# Patient Record
Sex: Female | Born: 1940
Health system: Southern US, Community
[De-identification: ages and names within clinical notes are randomized; demographics above are authoritative.]

## PROBLEM LIST (undated history)

## (undated) DIAGNOSIS — C4490 Unspecified malignant neoplasm of skin, unspecified: Secondary | ICD-10-CM

## (undated) DIAGNOSIS — Z83719 Family history of colon polyps, unspecified: Secondary | ICD-10-CM

## (undated) DIAGNOSIS — K219 Gastro-esophageal reflux disease without esophagitis: Secondary | ICD-10-CM

## (undated) DIAGNOSIS — C4491 Basal cell carcinoma of skin, unspecified: Secondary | ICD-10-CM

## (undated) DIAGNOSIS — Z8371 Family history of colonic polyps: Secondary | ICD-10-CM

## (undated) DIAGNOSIS — I10 Essential (primary) hypertension: Secondary | ICD-10-CM

## (undated) DIAGNOSIS — T7840XA Allergy, unspecified, initial encounter: Secondary | ICD-10-CM

## (undated) DIAGNOSIS — M1990 Unspecified osteoarthritis, unspecified site: Secondary | ICD-10-CM

## (undated) HISTORY — PX: BREAST BIOPSY: SHX20

## (undated) HISTORY — DX: Gastro-esophageal reflux disease without esophagitis: K21.9

## (undated) HISTORY — DX: Unspecified malignant neoplasm of skin, unspecified: C44.90

## (undated) HISTORY — DX: Basal cell carcinoma of skin, unspecified: C44.91

## (undated) HISTORY — PX: COLONOSCOPY WITH ESOPHAGOGASTRODUODENOSCOPY (EGD): SHX5779

---

## 1975-03-27 HISTORY — PX: ABDOMINAL HYSTERECTOMY: SHX81

## 1975-03-27 HISTORY — PX: APPENDECTOMY: SHX54

## 2005-02-01 ENCOUNTER — Ambulatory Visit: Payer: Self-pay | Admitting: Family Medicine

## 2006-03-08 ENCOUNTER — Ambulatory Visit: Payer: Self-pay | Admitting: Family Medicine

## 2007-10-21 ENCOUNTER — Ambulatory Visit: Payer: Self-pay | Admitting: Family Medicine

## 2008-01-16 ENCOUNTER — Ambulatory Visit: Payer: Self-pay | Admitting: Unknown Physician Specialty

## 2008-03-26 HISTORY — PX: VEIN SURGERY: SHX48

## 2008-10-25 ENCOUNTER — Ambulatory Visit: Payer: Self-pay | Admitting: Family Medicine

## 2009-11-16 ENCOUNTER — Ambulatory Visit: Payer: Self-pay | Admitting: Family Medicine

## 2010-01-26 ENCOUNTER — Ambulatory Visit: Payer: Self-pay | Admitting: Unknown Physician Specialty

## 2010-01-27 LAB — PATHOLOGY REPORT

## 2010-11-29 ENCOUNTER — Ambulatory Visit: Payer: Self-pay | Admitting: Family Medicine

## 2012-03-17 ENCOUNTER — Ambulatory Visit: Payer: Self-pay | Admitting: Family Medicine

## 2012-03-26 HISTORY — PX: COLON SURGERY: SHX602

## 2013-03-24 ENCOUNTER — Ambulatory Visit: Payer: Self-pay | Admitting: Family Medicine

## 2013-03-31 ENCOUNTER — Ambulatory Visit: Payer: Self-pay | Admitting: Surgery

## 2013-03-31 DIAGNOSIS — I10 Essential (primary) hypertension: Secondary | ICD-10-CM

## 2013-03-31 LAB — COMPREHENSIVE METABOLIC PANEL
Albumin: 3.6 g/dL (ref 3.4–5.0)
Alkaline Phosphatase: 74 U/L
Anion Gap: 3 — ABNORMAL LOW (ref 7–16)
BILIRUBIN TOTAL: 0.4 mg/dL (ref 0.2–1.0)
BUN: 16 mg/dL (ref 7–18)
Calcium, Total: 9.1 mg/dL (ref 8.5–10.1)
Chloride: 102 mmol/L (ref 98–107)
Co2: 29 mmol/L (ref 21–32)
Creatinine: 0.78 mg/dL (ref 0.60–1.30)
GLUCOSE: 99 mg/dL (ref 65–99)
Osmolality: 269 (ref 275–301)
Potassium: 3.9 mmol/L (ref 3.5–5.1)
SGOT(AST): 22 U/L (ref 15–37)
SGPT (ALT): 19 U/L (ref 12–78)
Sodium: 134 mmol/L — ABNORMAL LOW (ref 136–145)
Total Protein: 7.5 g/dL (ref 6.4–8.2)

## 2013-03-31 LAB — CBC WITH DIFFERENTIAL/PLATELET
BASOS ABS: 0.1 10*3/uL (ref 0.0–0.1)
Basophil %: 0.8 %
EOS PCT: 2.3 %
Eosinophil #: 0.2 10*3/uL (ref 0.0–0.7)
HCT: 37.9 % (ref 35.0–47.0)
HGB: 13 g/dL (ref 12.0–16.0)
LYMPHS ABS: 2.9 10*3/uL (ref 1.0–3.6)
Lymphocyte %: 39.3 %
MCH: 29.4 pg (ref 26.0–34.0)
MCHC: 34.3 g/dL (ref 32.0–36.0)
MCV: 86 fL (ref 80–100)
MONO ABS: 0.5 x10 3/mm (ref 0.2–0.9)
Monocyte %: 6.3 %
Neutrophil #: 3.8 10*3/uL (ref 1.4–6.5)
Neutrophil %: 51.3 %
Platelet: 213 10*3/uL (ref 150–440)
RBC: 4.42 10*6/uL (ref 3.80–5.20)
RDW: 13.4 % (ref 11.5–14.5)
WBC: 7.4 10*3/uL (ref 3.6–11.0)

## 2013-04-07 ENCOUNTER — Inpatient Hospital Stay: Payer: Self-pay | Admitting: Surgery

## 2013-04-08 LAB — PLATELET COUNT: PLATELETS: 215 10*3/uL (ref 150–440)

## 2013-04-08 LAB — HEMOGLOBIN: HGB: 13.2 g/dL (ref 12.0–16.0)

## 2013-04-10 LAB — PATHOLOGY REPORT

## 2014-07-17 NOTE — Op Note (Signed)
PATIENT NAME:  Katherine Gallegos, Katherine Gallegos I MR#:  [id number] DATE OF BIRTH:  June 02, 1940  DATE OF PROCEDURE:  04/07/2013  PREOPERATIVE DIAGNOSIS: Right colonic polyp.   POSTOPERATIVE DIAGNOSIS: Right colonic polyp.   PROCEDURE: Laparoscopic right colectomy.   SURGEON: Rochel Brome, MD  ANESTHESIA: General.   INDICATIONS: This 74 year old female recently had colonoscopy findings of a serrated adenoma of the cecum. This was sessile and could not be removed with the laparoscope and surgery was recommended for definitive treatment. Also noted is that she has had a previous hysterectomy.   DESCRIPTION OF PROCEDURE: The patient was placed on the operating table in the supine position under general anesthesia. The abdomen was prepared with ChloraPrep and draped in a sterile manner.   A short incision was made below the umbilicus and carried down to the deep fascia which was grasped with laryngeal hook and elevated. A Veress needle was inserted, aspirated, and irrigated with a saline solution. Next, the peritoneal cavity was insufflated with carbon dioxide. The Veress needle was removed. The 10 mm cannula was inserted. The 10 mm, 0 degree laparoscope was inserted to view the peritoneal cavity. The right colon was noted. There were multiple adhesions between the right colon and the anterior abdominal wall. The liver appeared normal. Another incision was made in the epigastrium to insert another 11 mm cannula. Another incision was made in the right lower quadrant to introduce a 5 mm cannula and ultimately a fourth cannula, which was a 5 mm cannula, in the right upper quadrant.   With the patient turned a few degrees to the left and in some Trendelenburg position multiple adhesions were dissected with the Harmonic scalpel in the right lower quadrant mobilizing the cecum and the terminal ileum. Also the dissection was carried up to the hepatic flexure. Subsequently, I did note that there were some adhesions between the  omentum and the pelvis which were divided with the Harmonic scalpel mobilizing the omentum. Next, a portion of omentum was separated from the right transverse colon and the right transverse colon was mobilized. There were some adhesions between the right transverse colon and the gallbladder, which were divided with the Harmonic scalpel. The right colon was separated from attachments to the stomach and also on mobilization the duodenum was noted and mesentery dissected away from the duodenum. Further dissection was carried out on the right colon. The ureter was identified. The colon was further mobilized and subsequently it had been mobilized satisfactorily to complete the laparoscopic portion of the procedure. It was noted that the appendix was not seen and apparently was removed when she had a hysterectomy.   Next, the laparoscopic instruments were removed. The two 5 mm port sites were closed with 4-0 nylon vertical mattress sutures. An incision was made from the infraumbilical port site to the supraumbilical port site and this incision was approximately 2-1/2 inches in length and was carried down through subcutaneous tissues. The linea alba was incised. The peritoneal cavity was opened enough that the right colon could be delivered up onto the abdominal wall. There was no grossly palpable mass and there was no grossly palpable adenopathy. A window was created in the mesentery of the small bowel just approximately 4 inches proximal to the ileocecal valve. Also a window was created in the transverse colon slightly to the right of the palpable middle colic vessels. The mesenteric dissection was begun at both sites with the Harmonic scalpel. It is noted that the dissection was carried out proximally and distally until  the ileocolic artery and vein were ligated with 0 chromic and divided with the Harmonic scalpel. Next, the small bowel was placed beside the transverse colon. There were some adhesions attaching the  omentum to the tenia coli of the transverse colon and these were dissected free. Next, holding the bowel with Allis clamps an enterotomy was made and also a colotomy made with electrocautery. Anastomosis was begun with insertion of the GIA 75 mm stapler and began the anastomosis along the antimesenteric border. The staple line was hemostatic. The anastomosis was completed with application of the OI-32 stapler, which was placed perpendicular to the first. The anastomosis looked good, was widely patent. Several small bleeding points were cauterized. The junction of the staple line was imbricated with 5-0 Vicryl. Also, the apex of the staple line was imbricated with 5-0 Vicryl. The mesenteric defect was closed with running 3-0 chromic. Hemostasis was intact. It is noted that during the course of the procedure there was minimal amount of bleeding. There was some bleeding related to mobilizing the omentum, but this subsequently resolved. Estimated blood loss for the procedure was approximately 15 mL.   Having completed the anastomosis and closure of the mesenteric defect the omentum was brought beneath the wound. The gloves, gown, instruments, and towels were changed. Next, the pull suction was inserted and there was very minimal amount of serosanguineous fluid within the abdominal cavity, and the pull suction was removed. Next, the midline fascia was closed with interrupted 0 Maxon figure-of-eight sutures. The skin was closed with interrupted 4-0 nylon vertical mattress sutures. Dressings were applied with paper tape. The patient tolerated surgery satisfactorily and was then prepared for transfer to the recovery room. ____________________________ Lenna Sciara. Rochel Brome, MD jws:sb D: 04/07/2013 15:10:05 ET T: 04/07/2013 15:26:29 ET JOB#: [id number]  cc: Loreli Dollar, MD, <Dictator> Loreli Dollar MD ELECTRONICALLY SIGNED 04/08/2013 18:09

## 2014-07-17 NOTE — Discharge Summary (Signed)
PATIENT NAME:  Katherine Gallegos, Katherine Gallegos I MR#:  [id number] DATE OF BIRTH:  June 29, 1940  DATE OF ADMISSION:  04/07/2013 DATE OF DISCHARGE:  04/10/2013  HISTORY OF PRESENT ILLNESS: This 74 year old female was admitted through the outpatient surgery department for laparoscopic right colectomy. She had recently had colonoscopy with findings of a 15 mm serrated adenoma of the cecum.   Other history includes hypertension.   Details are recorded on the typed H and P.   She did have bowel preparation at home and was brought in through the outpatient surgery department and was carried to the operating room. She did have a preop prophylactic antibiotic. Had a laparoscopic right colectomy.   Postoperatively, she was treated with IV fluids and prophylactic subcutaneous heparin and analgesics. She was initially begun on a clear liquid diet and later advanced to a full liquid diet, which she tolerated well. Did demonstrate bowel activity, passing some stool and gas and had minimal postoperative pain.  Final pathology demonstrated a 1.9 cm sessile serrated adenoma of the cecum.  Eleven lymph nodes were unremarkable.   DIAGNOSIS:  Serrated adenoma of the cecum.   OPERATION: Laparoscopic right colectomy.   DISCHARGE INSTRUCTIONS: Wound care instructions given and plans made for follow-up in the office.  ____________________________ J. Rochel Brome, MD jws:dp D: 04/21/2013 09:37:11 ET T: 04/21/2013 10:06:07 ET JOB#: [id number]  cc: Loreli Dollar, MD, <Dictator> Loreli Dollar MD ELECTRONICALLY SIGNED 04/25/2013 19:50

## 2014-07-21 ENCOUNTER — Other Ambulatory Visit: Payer: Self-pay | Admitting: Family Medicine

## 2014-07-21 DIAGNOSIS — Z1231 Encounter for screening mammogram for malignant neoplasm of breast: Secondary | ICD-10-CM

## 2014-08-05 ENCOUNTER — Ambulatory Visit: Payer: Medicare HMO

## 2014-08-11 ENCOUNTER — Ambulatory Visit
Admission: RE | Admit: 2014-08-11 | Discharge: 2014-08-11 | Disposition: A | Discharge status: Home / Self Care | Payer: Commercial Managed Care - HMO | Source: Ambulatory Visit | Attending: Family Medicine | Admitting: Family Medicine

## 2014-08-11 DIAGNOSIS — Z1231 Encounter for screening mammogram for malignant neoplasm of breast: Secondary | ICD-10-CM | POA: Insufficient documentation

## 2014-08-19 ENCOUNTER — Ambulatory Visit: Payer: Medicare HMO

## 2015-07-11 DIAGNOSIS — R5383 Other fatigue: Secondary | ICD-10-CM | POA: Diagnosis not present

## 2015-07-11 DIAGNOSIS — R5381 Other malaise: Secondary | ICD-10-CM | POA: Diagnosis not present

## 2015-07-11 DIAGNOSIS — I10 Essential (primary) hypertension: Secondary | ICD-10-CM | POA: Diagnosis not present

## 2015-07-11 DIAGNOSIS — E785 Hyperlipidemia, unspecified: Secondary | ICD-10-CM | POA: Diagnosis not present

## 2015-07-18 ENCOUNTER — Other Ambulatory Visit: Payer: Self-pay | Admitting: Family Medicine

## 2015-07-18 DIAGNOSIS — Z0000 Encounter for general adult medical examination without abnormal findings: Secondary | ICD-10-CM | POA: Diagnosis not present

## 2015-07-18 DIAGNOSIS — Z1231 Encounter for screening mammogram for malignant neoplasm of breast: Secondary | ICD-10-CM

## 2015-07-18 DIAGNOSIS — E784 Other hyperlipidemia: Secondary | ICD-10-CM | POA: Diagnosis not present

## 2015-07-18 DIAGNOSIS — I10 Essential (primary) hypertension: Secondary | ICD-10-CM | POA: Diagnosis not present

## 2015-08-04 DIAGNOSIS — M47816 Spondylosis without myelopathy or radiculopathy, lumbar region: Secondary | ICD-10-CM | POA: Diagnosis not present

## 2015-08-04 DIAGNOSIS — M545 Low back pain: Secondary | ICD-10-CM | POA: Diagnosis not present

## 2015-08-10 DIAGNOSIS — M545 Low back pain: Secondary | ICD-10-CM | POA: Diagnosis not present

## 2015-08-12 DIAGNOSIS — M546 Pain in thoracic spine: Secondary | ICD-10-CM | POA: Diagnosis not present

## 2015-08-15 ENCOUNTER — Ambulatory Visit: Payer: Commercial Managed Care - HMO

## 2015-08-24 ENCOUNTER — Other Ambulatory Visit: Payer: Self-pay | Admitting: Family Medicine

## 2015-08-24 ENCOUNTER — Ambulatory Visit
Admission: RE | Admit: 2015-08-24 | Discharge: 2015-08-24 | Disposition: A | Discharge status: Home / Self Care | Payer: PPO | Source: Ambulatory Visit | Attending: Family Medicine | Admitting: Family Medicine

## 2015-08-24 DIAGNOSIS — Z1231 Encounter for screening mammogram for malignant neoplasm of breast: Secondary | ICD-10-CM | POA: Diagnosis not present

## 2015-08-25 ENCOUNTER — Other Ambulatory Visit: Payer: Self-pay | Admitting: Family Medicine

## 2015-08-25 DIAGNOSIS — R921 Mammographic calcification found on diagnostic imaging of breast: Secondary | ICD-10-CM

## 2015-08-30 ENCOUNTER — Ambulatory Visit
Admission: RE | Admit: 2015-08-30 | Discharge: 2015-08-30 | Disposition: A | Discharge status: Home / Self Care | Payer: PPO | Source: Ambulatory Visit | Attending: Family Medicine | Admitting: Family Medicine

## 2015-08-30 ENCOUNTER — Other Ambulatory Visit: Payer: Self-pay | Admitting: Family Medicine

## 2015-08-30 DIAGNOSIS — R921 Mammographic calcification found on diagnostic imaging of breast: Secondary | ICD-10-CM | POA: Diagnosis not present

## 2015-10-04 DIAGNOSIS — I10 Essential (primary) hypertension: Secondary | ICD-10-CM | POA: Diagnosis not present

## 2015-10-07 DIAGNOSIS — Z0130 Encounter for examination of blood pressure without abnormal findings: Secondary | ICD-10-CM | POA: Diagnosis not present

## 2015-11-09 DIAGNOSIS — I10 Essential (primary) hypertension: Secondary | ICD-10-CM | POA: Diagnosis not present

## 2016-01-23 ENCOUNTER — Other Ambulatory Visit: Payer: Self-pay | Admitting: Family Medicine

## 2016-01-23 DIAGNOSIS — R928 Other abnormal and inconclusive findings on diagnostic imaging of breast: Secondary | ICD-10-CM

## 2016-02-08 DIAGNOSIS — N390 Urinary tract infection, site not specified: Secondary | ICD-10-CM | POA: Diagnosis not present

## 2016-02-08 DIAGNOSIS — H6123 Impacted cerumen, bilateral: Secondary | ICD-10-CM | POA: Diagnosis not present

## 2016-02-08 DIAGNOSIS — I10 Essential (primary) hypertension: Secondary | ICD-10-CM | POA: Diagnosis not present

## 2016-02-09 DIAGNOSIS — H6123 Impacted cerumen, bilateral: Secondary | ICD-10-CM | POA: Diagnosis not present

## 2016-02-09 DIAGNOSIS — I10 Essential (primary) hypertension: Secondary | ICD-10-CM | POA: Diagnosis not present

## 2016-02-09 DIAGNOSIS — N390 Urinary tract infection, site not specified: Secondary | ICD-10-CM | POA: Diagnosis not present

## 2016-02-10 DIAGNOSIS — N390 Urinary tract infection, site not specified: Secondary | ICD-10-CM | POA: Diagnosis not present

## 2016-03-05 ENCOUNTER — Other Ambulatory Visit: Payer: Self-pay | Admitting: Family Medicine

## 2016-03-05 ENCOUNTER — Ambulatory Visit
Admission: RE | Admit: 2016-03-05 | Discharge: 2016-03-05 | Disposition: A | Discharge status: Home / Self Care | Payer: PPO | Source: Ambulatory Visit | Attending: Family Medicine | Admitting: Family Medicine

## 2016-03-05 DIAGNOSIS — R928 Other abnormal and inconclusive findings on diagnostic imaging of breast: Secondary | ICD-10-CM

## 2016-03-05 DIAGNOSIS — R921 Mammographic calcification found on diagnostic imaging of breast: Secondary | ICD-10-CM

## 2016-04-02 ENCOUNTER — Ambulatory Visit
Admission: RE | Admit: 2016-04-02 | Discharge: 2016-04-02 | Disposition: A | Discharge status: Home / Self Care | Payer: PPO | Source: Ambulatory Visit | Attending: Family Medicine | Admitting: Family Medicine

## 2016-04-02 DIAGNOSIS — R921 Mammographic calcification found on diagnostic imaging of breast: Secondary | ICD-10-CM | POA: Diagnosis not present

## 2016-04-02 DIAGNOSIS — R928 Other abnormal and inconclusive findings on diagnostic imaging of breast: Secondary | ICD-10-CM

## 2016-04-02 DIAGNOSIS — N6012 Diffuse cystic mastopathy of left breast: Secondary | ICD-10-CM | POA: Diagnosis not present

## 2016-04-02 HISTORY — PX: BREAST BIOPSY: SHX20

## 2016-04-03 ENCOUNTER — Ambulatory Visit: Payer: Commercial Managed Care - HMO

## 2016-04-03 LAB — SURGICAL PATHOLOGY

## 2016-04-06 DIAGNOSIS — Z86010 Personal history of colonic polyps: Secondary | ICD-10-CM | POA: Diagnosis not present

## 2016-04-06 DIAGNOSIS — Z800 Family history of malignant neoplasm of digestive organs: Secondary | ICD-10-CM | POA: Insufficient documentation

## 2016-05-29 DIAGNOSIS — I10 Essential (primary) hypertension: Secondary | ICD-10-CM | POA: Diagnosis not present

## 2016-06-12 ENCOUNTER — Encounter: Payer: Self-pay | Admitting: *Deleted

## 2016-06-13 ENCOUNTER — Ambulatory Visit
Admission: RE | Admit: 2016-06-13 | Discharge: 2016-06-13 | Disposition: A | Discharge status: Home / Self Care | Payer: PPO | Source: Ambulatory Visit | Attending: Unknown Physician Specialty | Admitting: Unknown Physician Specialty

## 2016-06-13 ENCOUNTER — Encounter: Payer: Self-pay | Admitting: *Deleted

## 2016-06-13 ENCOUNTER — Ambulatory Visit: Payer: PPO | Admitting: Anesthesiology

## 2016-06-13 ENCOUNTER — Encounter
Admission: RE | Disposition: A | Discharge status: Home / Self Care | Payer: Self-pay | Source: Ambulatory Visit | Attending: Unknown Physician Specialty

## 2016-06-13 DIAGNOSIS — Z1211 Encounter for screening for malignant neoplasm of colon: Secondary | ICD-10-CM | POA: Insufficient documentation

## 2016-06-13 DIAGNOSIS — K648 Other hemorrhoids: Secondary | ICD-10-CM | POA: Diagnosis not present

## 2016-06-13 DIAGNOSIS — K5790 Diverticulosis of intestine, part unspecified, without perforation or abscess without bleeding: Secondary | ICD-10-CM | POA: Diagnosis not present

## 2016-06-13 DIAGNOSIS — I10 Essential (primary) hypertension: Secondary | ICD-10-CM | POA: Diagnosis not present

## 2016-06-13 DIAGNOSIS — Z803 Family history of malignant neoplasm of breast: Secondary | ICD-10-CM | POA: Insufficient documentation

## 2016-06-13 DIAGNOSIS — K5730 Diverticulosis of large intestine without perforation or abscess without bleeding: Secondary | ICD-10-CM | POA: Diagnosis not present

## 2016-06-13 DIAGNOSIS — Z90710 Acquired absence of both cervix and uterus: Secondary | ICD-10-CM | POA: Insufficient documentation

## 2016-06-13 DIAGNOSIS — M1990 Unspecified osteoarthritis, unspecified site: Secondary | ICD-10-CM | POA: Diagnosis not present

## 2016-06-13 DIAGNOSIS — Z808 Family history of malignant neoplasm of other organs or systems: Secondary | ICD-10-CM | POA: Diagnosis not present

## 2016-06-13 DIAGNOSIS — Z86010 Personal history of colonic polyps: Secondary | ICD-10-CM | POA: Insufficient documentation

## 2016-06-13 DIAGNOSIS — Z79899 Other long term (current) drug therapy: Secondary | ICD-10-CM | POA: Diagnosis not present

## 2016-06-13 DIAGNOSIS — K640 First degree hemorrhoids: Secondary | ICD-10-CM | POA: Insufficient documentation

## 2016-06-13 DIAGNOSIS — D124 Benign neoplasm of descending colon: Secondary | ICD-10-CM | POA: Diagnosis not present

## 2016-06-13 DIAGNOSIS — K635 Polyp of colon: Secondary | ICD-10-CM | POA: Diagnosis not present

## 2016-06-13 HISTORY — DX: Essential (primary) hypertension: I10

## 2016-06-13 HISTORY — DX: Family history of colonic polyps: Z83.71

## 2016-06-13 HISTORY — PX: COLONOSCOPY WITH PROPOFOL: SHX5780

## 2016-06-13 HISTORY — DX: Unspecified osteoarthritis, unspecified site: M19.90

## 2016-06-13 HISTORY — DX: Family history of colon polyps, unspecified: Z83.719

## 2016-06-13 HISTORY — DX: Allergy, unspecified, initial encounter: T78.40XA

## 2016-06-13 SURGERY — COLONOSCOPY WITH PROPOFOL
Anesthesia: General

## 2016-06-13 MED ORDER — SODIUM CHLORIDE 0.9 % IJ SOLN
INTRAMUSCULAR | Status: AC
  Filled 2016-06-13: qty 10

## 2016-06-13 MED ORDER — PROPOFOL 500 MG/50ML IV EMUL
INTRAVENOUS | Status: AC
  Filled 2016-06-13: qty 50

## 2016-06-13 MED ORDER — EPHEDRINE SULFATE 50 MG/ML IJ SOLN
INTRAMUSCULAR | Status: DC | PRN
  Administered 2016-06-13: 10 mg via INTRAVENOUS

## 2016-06-13 MED ORDER — FENTANYL CITRATE (PF) 100 MCG/2ML IJ SOLN
INTRAMUSCULAR | Status: DC | PRN
  Administered 2016-06-13 (×2): 50 ug via INTRAVENOUS

## 2016-06-13 MED ORDER — SODIUM CHLORIDE 0.9 % IV SOLN: INTRAVENOUS | Status: DC

## 2016-06-13 MED ORDER — FENTANYL CITRATE (PF) 100 MCG/2ML IJ SOLN
INTRAMUSCULAR | Status: AC
  Filled 2016-06-13: qty 2

## 2016-06-13 MED ORDER — LIDOCAINE HCL (PF) 2 % IJ SOLN
INTRAMUSCULAR | Status: DC | PRN
Start: 2016-06-13 — End: 2016-06-13
  Administered 2016-06-13: 40 mg

## 2016-06-13 MED ORDER — PROPOFOL 10 MG/ML IV BOLUS
INTRAVENOUS | Status: DC | PRN
  Administered 2016-06-13: 10 mg via INTRAVENOUS
  Administered 2016-06-13: 40 mg via INTRAVENOUS

## 2016-06-13 MED ORDER — LIDOCAINE HCL (PF) 2 % IJ SOLN
INTRAMUSCULAR | Status: AC
  Filled 2016-06-13: qty 2

## 2016-06-13 MED ORDER — PROPOFOL 500 MG/50ML IV EMUL
INTRAVENOUS | Status: DC | PRN
  Administered 2016-06-13: 50 ug/kg/min via INTRAVENOUS

## 2016-06-13 MED ORDER — SODIUM CHLORIDE 0.9 % IV SOLN
INTRAVENOUS | Status: DC
  Administered 2016-06-13: 1000 mL via INTRAVENOUS

## 2016-06-13 MED ORDER — EPHEDRINE SULFATE 50 MG/ML IJ SOLN
INTRAMUSCULAR | Status: AC
  Filled 2016-06-13: qty 1

## 2016-06-13 NOTE — Op Note (Signed)
[facility] Patient Name: Katherine Gallegos Procedure Date: 06/13/2016 11:05 AM MRN: [id number] Account #: [id number] Date of Birth: [birth year] Admit Type: Outpatient Age: 76 Room: [facility] ENDO ROOM 4 Gender: Female Note Status: Finalized Procedure:            Colonoscopy Indications:          High risk colon cancer surveillance: Personal history                        of colonic polyps Providers:            Manya Silvas, MD Referring MD:         Irven Easterly. Kary Kos, MD (Referring MD) Medicines:            Propofol per Anesthesia Complications:        No immediate complications. Procedure:            Pre-Anesthesia Assessment:                       - After reviewing the risks and benefits, the patient                        was deemed in satisfactory condition to undergo the                        procedure.                       After obtaining informed consent, the colonoscope was                        passed under direct vision. Throughout the procedure,                        the patient's blood pressure, pulse, and oxygen                        saturations were monitored continuously. The                        Colonoscope was introduced through the anus and                        advanced to the the cecum, identified by appendiceal                        orifice and ileocecal valve. The colonoscopy was                        somewhat difficult due to a tortuous colon. The patient                        tolerated the procedure well. The quality of the bowel                        preparation was excellent. Findings:      A 8 mm polyp was found in the distal descending colon. The polyp was       sessile. The polyp was removed with a cold snare. Resection and       retrieval were complete.  A diminutive polyp was found in the descending colon. The polyp was       sessile. The polyp was removed with a jumbo cold forceps. Resection and   retrieval were complete.      A few small-mouthed diverticula were found in the sigmoid colon and       descending colon.      Internal hemorrhoids were found during endoscopy. The hemorrhoids were       small and Grade I (internal hemorrhoids that do not prolapse).      The exam was otherwise without abnormality. Impression:           - One 8 mm polyp in the distal descending colon,                        removed with a cold snare. Resected and retrieved.                       - One diminutive polyp in the descending colon, removed                        with a jumbo cold forceps. Resected and retrieved.                       - Diverticulosis in the sigmoid colon and in the                        descending colon.                       - Internal hemorrhoids.                       - The examination was otherwise normal. Recommendation:       - Await pathology results. Manya Silvas, MD 06/13/2016 11:51:43 AM This report has been signed electronically. Number of Addenda: 0 Note Initiated On: 06/13/2016 11:05 AM Scope Withdrawal Time: 0 hours 13 minutes 14 seconds  Total Procedure Duration: 0 hours 23 minutes 54 seconds       [facility]

## 2016-06-13 NOTE — H&P (Signed)
   Primary Care Physician:  Maryland Pink, MD Primary Gastroenterologist:  Dr. Vira Agar  Pre-Procedure History & Physical: HPI:  Katherine Gallegos is a 76 y.o. female is here for an colonoscopy.   Past Medical History:  Diagnosis Date  . Allergic state   . Arthritis   . FH: colon polyps   . Hypertension     Past Surgical History:  Procedure Laterality Date  . ABDOMINAL HYSTERECTOMY    . BREAST BIOPSY Bilateral 20 + yrs    excisional bx. negative results  . BREAST BIOPSY Left 04/02/2016   Affirm Biopsy- Path pending  . COLONOSCOPY WITH ESOPHAGOGASTRODUODENOSCOPY (EGD)    . VEIN SURGERY  2010    Prior to Admission medications   Medication Sig Start Date End Date Taking? Authorizing Provider  amLODipine (NORVASC) 5 MG tablet Take 7.5 mg by mouth daily.   Yes Historical Provider, MD  metoprolol tartrate (LOPRESSOR) 25 MG tablet Take 25 mg by mouth 2 (two) times daily.   Yes Historical Provider, MD  polyethylene glycol powder (GLYCOLAX/MIRALAX) powder Take 1 Container by mouth once.   Yes Historical Provider, MD    Allergies as of 05/15/2016  . (Not on File)    Family History  Problem Relation Age of Onset  . Cancer Sister     bone ca.   . Breast cancer Sister     Social History   Social History  . Marital status: Married    Spouse name: N/A  . Number of children: N/A  . Years of education: N/A   Occupational History  . Not on file.   Social History Main Topics  . Smoking status: Never Smoker  . Smokeless tobacco: Never Used  . Alcohol use No  . Drug use: No  . Sexual activity: Not on file   Other Topics Concern  . Not on file   Social History Narrative  . No narrative on file    Review of Systems: See HPI, otherwise negative ROS  Physical Exam: BP 137/67   Pulse 64   Temp 97.7 F (36.5 C) (Tympanic)   Resp 18   Ht 5\' 3"  (1.6 m)   Wt 58.1 kg (128 lb)   SpO2 100%   BMI 22.67 kg/m  General:   Alert,  pleasant and cooperative in NAD Head:   Normocephalic and atraumatic. Neck:  Supple; no masses or thyromegaly. Lungs:  Clear throughout to auscultation.    Heart:  Regular rate and rhythm. Abdomen:  Soft, nontender and nondistended. Normal bowel sounds, without guarding, and without rebound.   Neurologic:  Alert and  oriented x4;  grossly normal neurologically.  Impression/Plan: Katherine Gallegos is here for an colonoscopy to be performed for [facility] colon polyp.  Risks, benefits, limitations, and alternatives regarding  colonoscopy have been reviewed with the patient.  Questions have been answered.  All parties agreeable.   Gaylyn Cheers, MD  06/13/2016, 11:13 AM

## 2016-06-13 NOTE — Anesthesia Post-op Follow-up Note (Cosign Needed)
Anesthesia QCDR form completed.        

## 2016-06-13 NOTE — Anesthesia Preprocedure Evaluation (Signed)
Anesthesia Evaluation  Patient identified by MRN, date of birth, ID band Patient awake    Reviewed: Allergy & Precautions, NPO status , Patient's Chart, lab work & pertinent test results  Airway Mallampati: II       Dental  (+) Teeth Intact   Pulmonary neg pulmonary ROS,    breath sounds clear to auscultation       Cardiovascular Exercise Tolerance: Good hypertension, Pt. on home beta blockers  Rhythm:Regular     Neuro/Psych negative neurological ROS  negative psych ROS   GI/Hepatic negative GI ROS, Neg liver ROS,   Endo/Other  negative endocrine ROS  Renal/GU negative Renal ROS     Musculoskeletal   Abdominal Normal abdominal exam  (+)   Peds negative pediatric ROS (+)  Hematology negative hematology ROS (+)   Anesthesia Other Findings   Reproductive/Obstetrics                             Anesthesia Physical Anesthesia Plan  ASA: II  Anesthesia Plan: General   Post-op Pain Management:    Induction: Intravenous  Airway Management Planned: Natural Airway and Nasal Cannula  Additional Equipment:   Intra-op Plan:   Post-operative Plan:   Informed Consent: I have reviewed the patients History and Physical, chart, labs and discussed the procedure including the risks, benefits and alternatives for the proposed anesthesia with the patient or authorized representative who has indicated his/her understanding and acceptance.     Plan Discussed with: CRNA  Anesthesia Plan Comments:         Anesthesia Quick Evaluation

## 2016-06-13 NOTE — Anesthesia Postprocedure Evaluation (Signed)
Anesthesia Post Note  Patient: Katherine Gallegos  Procedure(s) Performed: Procedure(s) (LRB): COLONOSCOPY WITH PROPOFOL (N/A)  Patient location during evaluation: PACU Anesthesia Type: General Level of consciousness: awake Pain management: pain level controlled Vital Signs Assessment: post-procedure vital signs reviewed and stable Respiratory status: spontaneous breathing Cardiovascular status: stable Anesthetic complications: no     Last Vitals:  Vitals:   06/13/16 1213 06/13/16 1223  BP: 129/67 135/76  Pulse: 67 66  Resp: 15 20  Temp:      Last Pain:  Vitals:   06/13/16 1153  TempSrc: Tympanic                 VAN STAVEREN,[name]

## 2016-06-13 NOTE — Transfer of Care (Signed)
Immediate Anesthesia Transfer of Care Note  Patient: Katherine Gallegos  Procedure(s) Performed: Procedure(s): COLONOSCOPY WITH PROPOFOL (N/A)  Patient Location: PACU  Anesthesia Type:General  Level of Consciousness: sedated  Airway & Oxygen Therapy: Patient Spontanous Breathing and Patient connected to nasal cannula oxygen  Post-op Assessment: Report given to RN and Post -op Vital signs reviewed and stable  Post vital signs: Reviewed and stable  Last Vitals:  Vitals:   06/13/16 1040  BP: 137/67  Pulse: 64  Resp: 18  Temp: 36.5 C    Last Pain:  Vitals:   06/13/16 1040  TempSrc: Tympanic         Complications: No apparent anesthesia complications

## 2016-06-14 ENCOUNTER — Encounter: Payer: Self-pay | Admitting: Unknown Physician Specialty

## 2016-06-14 LAB — SURGICAL PATHOLOGY

## 2016-07-18 DIAGNOSIS — H524 Presbyopia: Secondary | ICD-10-CM | POA: Diagnosis not present

## 2016-07-18 DIAGNOSIS — H52223 Regular astigmatism, bilateral: Secondary | ICD-10-CM | POA: Diagnosis not present

## 2016-07-18 DIAGNOSIS — H40003 Preglaucoma, unspecified, bilateral: Secondary | ICD-10-CM | POA: Diagnosis not present

## 2016-07-18 DIAGNOSIS — H5203 Hypermetropia, bilateral: Secondary | ICD-10-CM | POA: Diagnosis not present

## 2016-07-19 DIAGNOSIS — H6123 Impacted cerumen, bilateral: Secondary | ICD-10-CM | POA: Diagnosis not present

## 2016-07-19 DIAGNOSIS — I10 Essential (primary) hypertension: Secondary | ICD-10-CM | POA: Diagnosis not present

## 2016-07-19 DIAGNOSIS — Z0000 Encounter for general adult medical examination without abnormal findings: Secondary | ICD-10-CM | POA: Diagnosis not present

## 2016-07-19 DIAGNOSIS — E785 Hyperlipidemia, unspecified: Secondary | ICD-10-CM | POA: Diagnosis not present

## 2016-10-31 DIAGNOSIS — I10 Essential (primary) hypertension: Secondary | ICD-10-CM | POA: Diagnosis not present

## 2017-01-29 DIAGNOSIS — R300 Dysuria: Secondary | ICD-10-CM | POA: Diagnosis not present

## 2017-02-12 NOTE — Progress Notes (Signed)
02/13/2017 7:45 PM   Katherine Gallegos 1940/07/12 [id number]  Referring provider: Maryland Pink, MD [address] [facility] [place], [place] [address]  Chief Complaint  Patient presents with  . Dysuria    HPI: Patient is a 76 -year-old Caucasian female who is referred to [place] by, Marthe Patch, for dysuria.  She had been experiencing suprapubic pressure, a bulge in the vagina and trickling during the day.  Her symptoms seems to abate at night.    Today, she complains of frequency, urgency, dysuria (intermittent) and nocturia x 3 (gets up with husband) and hesitancy.  Her PVR is 13 mL.   She denies gross hematuria, back pain, abdominal pain or flank pain.  She has not had any recent fevers, chills, nausea or vomiting.  Her UA is unremarkable.    She does not have a history of nephrolithiasis, GU surgery or GU trauma.   She is not sexually active.  She is postmenopausal.     She denies constipation and/or diarrhea.   She does engage in good perineal hygiene. She does not take tub baths.  She does not have incontinence.    She is not having pain with bladder filling.    She has not had any recent imaging studies.    She is drinking 2 to 3 bottles of water daily.     Reviewed referral notes.    PMH: Past Medical History:  Diagnosis Date  . Allergic state   . Arthritis   . FH: colon polyps   . Hypertension   . Skin cancer     Surgical History: Past Surgical History:  Procedure Laterality Date  . ABDOMINAL HYSTERECTOMY  1977   Partial  . APPENDECTOMY  1977  . BREAST BIOPSY Bilateral 20 + yrs    excisional bx. negative results  . BREAST BIOPSY Left 04/02/2016   Affirm Biopsy- Path pending  . COLON SURGERY  2014   Large Polypectomy with Bowel Resection (Dr. Rochel Brome)  . COLONOSCOPY WITH ESOPHAGOGASTRODUODENOSCOPY (EGD)    . COLONOSCOPY WITH PROPOFOL N/A 06/13/2016   Procedure: COLONOSCOPY WITH PROPOFOL;  Surgeon: Manya Silvas, MD;  Location:  [facility] ENDOSCOPY;  Service: Endoscopy;  Laterality: N/A;  . VEIN SURGERY  2010    Home Medications:  Allergies as of 02/13/2017      Reactions   Hydrochlorothiazide Rash   Latex Rash   Lovastatin Other (See Comments)   Muscle pain      Medication List        Accurate as of 02/13/17 11:59 PM. Always use your most recent med list.          acetaminophen 325 MG tablet Commonly known as:  TYLENOL Take 650 mg by mouth every 6 (six) hours as needed.   amLODipine 5 MG tablet Commonly known as:  NORVASC Take 7.5 mg by mouth daily.   conjugated estrogens vaginal cream Commonly known as:  PREMARIN Place 1 Applicatorful vaginally daily. Apply 0.5mg  (pea-sized amount)  just inside the vaginal introitus with a finger-tip every night for two weeks and then Monday, Wednesday and Friday nights.   estradiol 0.1 MG/GM vaginal cream Commonly known as:  ESTRACE VAGINAL Apply 0.5mg  (pea-sized amount)  just inside the vaginal introitus with a finger-tip every night for two weeks and then Monday, Wednesday and Friday nights.   metoprolol tartrate 25 MG tablet Commonly known as:  LOPRESSOR Take 25 mg by mouth 2 (two) times daily.  Allergies:  Allergies  Allergen Reactions  . Hydrochlorothiazide Rash  . Latex Rash  . Lovastatin Other (See Comments)    Muscle pain    Family History: Family History  Problem Relation Age of Onset  . Cancer Sister        bone ca.   . Breast cancer Sister   . Hematuria Neg Hx   . Kidney cancer Neg Hx   . Kidney disease Neg Hx   . Prostate cancer Neg Hx   . Sickle cell trait Neg Hx   . Tuberculosis Neg Hx     Social History:  reports that  has never smoked. she has never used smokeless tobacco. She reports that she does not drink alcohol or use drugs.  ROS: UROLOGY Frequent Urination?: Yes Hard to postpone urination?: Yes Burning/pain with urination?: Yes Get up at night to urinate?: Yes Leakage of urine?: No Urine stream starts and  stops?: No Trouble starting stream?: Yes Do you have to strain to urinate?: No Blood in urine?: No Urinary tract infection?: No Sexually transmitted disease?: No Injury to kidneys or bladder?: No Painful intercourse?: No Weak stream?: No Currently pregnant?: No Vaginal bleeding?: No  Gastrointestinal Nausea?: No Vomiting?: No Indigestion/heartburn?: No Diarrhea?: No Constipation?: No  Constitutional Fever: No Night sweats?: No Weight loss?: No Fatigue?: No  Skin Skin rash/lesions?: Yes Itching?: Yes  Eyes Blurred vision?: Yes Double vision?: No  Ears/Nose/Throat Sore throat?: No Sinus problems?: Yes  Hematologic/Lymphatic Swollen glands?: No Easy bruising?: No  Cardiovascular Leg swelling?: No Chest pain?: No  Respiratory Cough?: No Shortness of breath?: No  Endocrine Excessive thirst?: No  Musculoskeletal Back pain?: Yes Joint pain?: Yes  Neurological Headaches?: No Dizziness?: No  Psychologic Depression?: No Anxiety?: No  Physical Exam: BP 131/74   Pulse 62   Ht 5\' 3"  (1.6 m)   Wt 130 lb 4.8 oz (59.1 kg)   BMI 23.08 kg/m   Constitutional: Well nourished. Alert and oriented, No acute distress. HEENT: [place] AT, moist mucus membranes. Trachea midline, no masses. Cardiovascular: No clubbing, cyanosis, or edema. Respiratory: Normal respiratory effort, no increased work of breathing. GI: Abdomen is soft, non tender, non distended, no abdominal masses. Liver and spleen not palpable.  No hernias appreciated.  Stool sample for occult testing is not indicated.   GU: No CVA tenderness.  No bladder fullness or masses.  Atrophic external genitalia, normal pubic hair distribution, no lesions.  Normal urethral meatus, no lesions, no prolapse, no discharge.   No urethral masses, tenderness and/or tenderness. No bladder fullness, tenderness or masses. Pale vagina mucosa, poor estrogen effect, no discharge, no lesions, poor pelvic support, Grade III  cystocele or rectocele noted. Cervix and uterus is surgically absent.  No adnexal/parametria masses or tenderness noted.  Anus and perineum are without rashes or lesions.    Skin: No rashes, bruises or suspicious lesions. Lymph: No cervical or inguinal adenopathy. Neurologic: Grossly intact, no focal deficits, moving all 4 extremities. Psychiatric: Normal mood and affect.  Laboratory Data: Urinalysis Unremarkable.  See Epic. I have reviewed the labs.   Pertinent Imaging: Results for JACLYNNE, BALDO (MRN [id number]) as of 02/24/2017 19:49  Ref. Range 02/13/2017 10:31  Scan Result Unknown 13 ml     Assessment & Plan:    1. Cystocele  - will refer to gynecology for pessary fitting as patient is finding her cystocele uncomfortable  2. Vaginal atrophy  - Patient was given a sample of vaginal estrogen cream (Premarin) and instructed to apply  0.5mg  (pea-sized amount)  just inside the vaginal introitus with a finger-tip on Monday, Wednesday and Friday nights.  I explained to the patient that vaginally administered estrogen, which causes only a slight increase in the blood estrogen levels, have fewer contraindications and adverse systemic effects that oral HT.  - I have also given prescriptions for the Estrace cream and Premarin cream, so that the patient may carry them to the pharmacy to see which one of the branded creams would be most economical for her.  If she finds both medications cost prohibitive, she is instructed to call the office.  We can then call in a compounded vaginal estrogen cream for the patient that may be more affordable.    - She will follow up in three months for an exam.     Return for after pessary fitting.  These notes generated with voice recognition software. I apologize for typographical errors.  Zara Council, [facility] [address] [place], [place] [address] [phone]

## 2017-02-13 ENCOUNTER — Encounter: Payer: Self-pay | Admitting: Urology

## 2017-02-13 ENCOUNTER — Ambulatory Visit (INDEPENDENT_AMBULATORY_CARE_PROVIDER_SITE_OTHER): Payer: PPO | Admitting: Urology

## 2017-02-13 VITALS — BP 131/74 | HR 62 | Ht 63.0 in | Wt 130.3 lb

## 2017-02-13 DIAGNOSIS — N952 Postmenopausal atrophic vaginitis: Secondary | ICD-10-CM | POA: Diagnosis not present

## 2017-02-13 DIAGNOSIS — N8111 Cystocele, midline: Secondary | ICD-10-CM

## 2017-02-13 DIAGNOSIS — R300 Dysuria: Secondary | ICD-10-CM | POA: Diagnosis not present

## 2017-02-13 LAB — URINALYSIS, COMPLETE
BILIRUBIN UA: NEGATIVE
Glucose, UA: NEGATIVE
Ketones, UA: NEGATIVE
Nitrite, UA: NEGATIVE
PH UA: 5.5 (ref 5.0–7.5)
PROTEIN UA: NEGATIVE
Specific Gravity, UA: <1.005 — ABNORMAL LOW (ref 1.005–1.030)
UUROB: 0.2 mg/dL (ref 0.2–1.0)

## 2017-02-13 LAB — MICROSCOPIC EXAMINATION

## 2017-02-13 LAB — BLADDER SCAN AMB NON-IMAGING

## 2017-02-13 MED ORDER — ESTROGENS, CONJUGATED 0.625 MG/GM VA CREA
1.0000 | TOPICAL_CREAM | Freq: Every day | VAGINAL | 12 refills | Status: DC
Start: 2017-02-13 — End: 2022-02-26

## 2017-02-13 MED ORDER — ESTRADIOL 0.1 MG/GM VA CREA: TOPICAL_CREAM | VAGINAL | 12 refills | Status: DC

## 2017-02-13 NOTE — Patient Instructions (Signed)
Apply a pea-sized amount with your fingertip to the external vaginal introitus on Monday, Wednesday and Friday nights.

## 2017-02-26 ENCOUNTER — Ambulatory Visit: Payer: PPO | Admitting: Obstetrics and Gynecology

## 2017-02-26 ENCOUNTER — Encounter: Payer: Self-pay | Admitting: Obstetrics and Gynecology

## 2017-02-26 VITALS — BP 149/68 | HR 65 | Ht 63.0 in | Wt 135.1 lb

## 2017-02-26 DIAGNOSIS — N8110 Cystocele, unspecified: Secondary | ICD-10-CM

## 2017-02-26 DIAGNOSIS — N393 Stress incontinence (female) (male): Secondary | ICD-10-CM | POA: Diagnosis not present

## 2017-02-26 NOTE — Progress Notes (Signed)
HPI:      [name] is a 76 y.o. [phone] who LMP was No LMP recorded. Patient has had a hysterectomy.  Subjective:   She presents today after being seen by urology and sent for vaginal cuff prolapse.  She has some urine loss but this is not her main problem.  She feels something bulging from the vagina and she often gets back pain which resolves when she lies down. She previously had a hysterectomy for bleeding. She is not sexually active.  She does not care about future sexual activity. She takes no hormone replacement.    Hx: The following portions of the patient's history were reviewed and updated as appropriate:             She  has a past medical history of Allergic state, Arthritis, FH: colon polyps, Hypertension, and Skin cancer. She does not have a problem list on file. She  has a past surgical history that includes Breast biopsy (Bilateral, 20 + yrs ); Breast biopsy (Left, 04/02/2016); Vein Surgery (2010); Colonoscopy with esophagogastroduodenoscopy (egd); Colonoscopy with propofol (N/A, 06/13/2016); Abdominal hysterectomy (1977); Colon surgery (2014); and Appendectomy (1977). Her family history includes Breast cancer in her sister; Cancer in her sister. She  reports that  has never smoked. she has never used smokeless tobacco. She reports that she does not drink alcohol or use drugs. She is allergic to hydrochlorothiazide; latex; and lovastatin.       Review of Systems:  Review of Systems  Constitutional: Denied constitutional symptoms, night sweats, recent illness, fatigue, fever, insomnia and weight loss.  Eyes: Denied eye symptoms, eye pain, photophobia, vision change and visual disturbance.  Ears/Nose/Throat/Neck: Denied ear, nose, throat or neck symptoms, hearing loss, nasal discharge, sinus congestion and sore throat.  Cardiovascular: Denied cardiovascular symptoms, arrhythmia, chest pain/pressure, edema, exercise intolerance, orthopnea and palpitations.   Respiratory: Denied pulmonary symptoms, asthma, pleuritic pain, productive sputum, cough, dyspnea and wheezing.  Gastrointestinal: Denied, gastro-esophageal reflux, melena, nausea and vomiting.  Genitourinary: See HPI for additional information.  Musculoskeletal: Denied musculoskeletal symptoms, stiffness, swelling, muscle weakness and myalgia.  Dermatologic: Denied dermatology symptoms, rash and scar.  Neurologic: Denied neurology symptoms, dizziness, headache, neck pain and syncope.  Psychiatric: Denied psychiatric symptoms, anxiety and depression.  Endocrine: Denied endocrine symptoms including hot flashes and night sweats.   Meds:   Current Outpatient Medications on File Prior to Visit  Medication Sig Dispense Refill  . acetaminophen (TYLENOL) 325 MG tablet Take 650 mg by mouth every 6 (six) hours as needed.    Marland Kitchen amLODipine (NORVASC) 5 MG tablet Take 7.5 mg by mouth daily.    Marland Kitchen conjugated estrogens (PREMARIN) vaginal cream Place 1 Applicatorful vaginally daily. Apply 0.5mg  (pea-sized amount)  just inside the vaginal introitus with a finger-tip every night for two weeks and then Monday, Wednesday and Friday nights. 30 g 12  . metoprolol tartrate (LOPRESSOR) 25 MG tablet Take 25 mg by mouth 2 (two) times daily.     No current facility-administered medications on file prior to visit.     Objective:     Vitals:   02/26/17 0817  BP: (!) 149/68  Pulse: 65              Physical examination   Pelvic:   Vulva: Normal appearance.  No lesions.  Vagina: Complete vaginal cuff prolapse.vaginal cuff proximally 3 cm outside of introitus.  Support: Poor  Urethra No masses tenderness or scarring.  Meatus Normal size without lesions or prolapse.  Cervix:  Surgically absent  Anus: Normal exam.  No lesions.  Perineum: Normal exam.  No lesions.        Bimanual   Uterus:  Surgically absent  Adnexae: No masses.  Non-tender to palpation.  Cul-de-sac: Negative for abnormality.      Assessment:    [id number] There are no active problems to display for this patient.    1. SUI (stress urinary incontinence, female)   2. Vaginal prolapse     After long discussion with the patient she has decided that she would like this fixed definitively.  She is interested in pelvic surgery.   Plan:            1.  To schedule for preop visit when she has decided upon dates for surgery.  Surgery would be anterior and posterior repair TOT.  With plan to make the vagina very small so prolapse could not recur in the future as patient is not sexually active.  2.  Recommend preop estrogen vaginally.  Orders No orders of the defined types were placed in this encounter.   No orders of the defined types were placed in this encounter.     F/U  No Follow-up on file. I spent 33 minutes with this patient of which greater than 50% was spent discussing pelvic prolapse, stress incontinence, surgical and pessary correction, risks and benefits, all patient's questions were answered  Finis Bud, M.D. 02/26/2017 9:49 AM

## 2017-04-23 DIAGNOSIS — I10 Essential (primary) hypertension: Secondary | ICD-10-CM | POA: Diagnosis not present

## 2017-05-28 ENCOUNTER — Other Ambulatory Visit: Payer: Self-pay | Admitting: Family Medicine

## 2017-05-28 DIAGNOSIS — Z1231 Encounter for screening mammogram for malignant neoplasm of breast: Secondary | ICD-10-CM

## 2017-06-05 ENCOUNTER — Ambulatory Visit
Admission: RE | Admit: 2017-06-05 | Discharge: 2017-06-05 | Disposition: A | Discharge status: Home / Self Care | Payer: PPO | Source: Ambulatory Visit | Attending: Family Medicine | Admitting: Family Medicine

## 2017-06-05 DIAGNOSIS — Z1231 Encounter for screening mammogram for malignant neoplasm of breast: Secondary | ICD-10-CM | POA: Diagnosis not present

## 2017-07-16 DIAGNOSIS — N8110 Cystocele, unspecified: Secondary | ICD-10-CM | POA: Diagnosis not present

## 2017-07-16 DIAGNOSIS — R300 Dysuria: Secondary | ICD-10-CM | POA: Diagnosis not present

## 2017-07-16 DIAGNOSIS — I8390 Asymptomatic varicose veins of unspecified lower extremity: Secondary | ICD-10-CM | POA: Diagnosis not present

## 2017-07-16 DIAGNOSIS — R600 Localized edema: Secondary | ICD-10-CM | POA: Diagnosis not present

## 2017-07-16 DIAGNOSIS — I10 Essential (primary) hypertension: Secondary | ICD-10-CM | POA: Diagnosis not present

## 2017-07-16 DIAGNOSIS — N390 Urinary tract infection, site not specified: Secondary | ICD-10-CM | POA: Diagnosis not present

## 2017-08-08 DIAGNOSIS — R1310 Dysphagia, unspecified: Secondary | ICD-10-CM | POA: Diagnosis not present

## 2017-08-14 DIAGNOSIS — I10 Essential (primary) hypertension: Secondary | ICD-10-CM | POA: Diagnosis not present

## 2017-08-15 DIAGNOSIS — R1310 Dysphagia, unspecified: Secondary | ICD-10-CM | POA: Diagnosis not present

## 2017-08-20 ENCOUNTER — Encounter: Payer: Self-pay | Admitting: *Deleted

## 2017-08-21 ENCOUNTER — Other Ambulatory Visit: Payer: Self-pay

## 2017-08-21 ENCOUNTER — Ambulatory Visit: Payer: PPO | Admitting: Registered Nurse

## 2017-08-21 ENCOUNTER — Encounter: Payer: Self-pay | Admitting: *Deleted

## 2017-08-21 ENCOUNTER — Ambulatory Visit
Admission: RE | Admit: 2017-08-21 | Discharge: 2017-08-21 | Disposition: A | Discharge status: Home / Self Care | Payer: PPO | Source: Ambulatory Visit | Attending: Unknown Physician Specialty | Admitting: Unknown Physician Specialty

## 2017-08-21 ENCOUNTER — Encounter
Admission: RE | Disposition: A | Discharge status: Home / Self Care | Payer: Self-pay | Source: Ambulatory Visit | Attending: Unknown Physician Specialty

## 2017-08-21 DIAGNOSIS — Z85828 Personal history of other malignant neoplasm of skin: Secondary | ICD-10-CM | POA: Insufficient documentation

## 2017-08-21 DIAGNOSIS — I10 Essential (primary) hypertension: Secondary | ICD-10-CM | POA: Insufficient documentation

## 2017-08-21 DIAGNOSIS — K3189 Other diseases of stomach and duodenum: Secondary | ICD-10-CM | POA: Diagnosis not present

## 2017-08-21 DIAGNOSIS — K222 Esophageal obstruction: Secondary | ICD-10-CM | POA: Insufficient documentation

## 2017-08-21 DIAGNOSIS — K2950 Unspecified chronic gastritis without bleeding: Secondary | ICD-10-CM | POA: Insufficient documentation

## 2017-08-21 DIAGNOSIS — K2970 Gastritis, unspecified, without bleeding: Secondary | ICD-10-CM | POA: Diagnosis not present

## 2017-08-21 DIAGNOSIS — R1310 Dysphagia, unspecified: Secondary | ICD-10-CM | POA: Diagnosis not present

## 2017-08-21 DIAGNOSIS — M1990 Unspecified osteoarthritis, unspecified site: Secondary | ICD-10-CM | POA: Diagnosis not present

## 2017-08-21 HISTORY — PX: ESOPHAGOGASTRODUODENOSCOPY (EGD) WITH PROPOFOL: SHX5813

## 2017-08-21 SURGERY — ESOPHAGOGASTRODUODENOSCOPY (EGD) WITH PROPOFOL
Anesthesia: General

## 2017-08-21 MED ORDER — SODIUM CHLORIDE 0.9 % IV SOLN
INTRAVENOUS | Status: DC
  Administered 2017-08-21: 11:00:00 via INTRAVENOUS

## 2017-08-21 MED ORDER — PROPOFOL 10 MG/ML IV BOLUS
INTRAVENOUS | Status: DC | PRN
  Administered 2017-08-21: 20 mg via INTRAVENOUS
  Administered 2017-08-21: 50 mg via INTRAVENOUS

## 2017-08-21 MED ORDER — GLYCOPYRROLATE 0.2 MG/ML IJ SOLN
INTRAMUSCULAR | Status: DC | PRN
  Administered 2017-08-21: 0.2 mg via INTRAVENOUS

## 2017-08-21 MED ORDER — PROPOFOL 500 MG/50ML IV EMUL: INTRAVENOUS | Status: DC | PRN

## 2017-08-21 MED ORDER — PROPOFOL 10 MG/ML IV BOLUS: INTRAVENOUS | Status: DC | PRN

## 2017-08-21 MED ORDER — PROPOFOL 500 MG/50ML IV EMUL
INTRAVENOUS | Status: DC | PRN
  Administered 2017-08-21: 50 ug/kg/min via INTRAVENOUS

## 2017-08-21 MED ORDER — SODIUM CHLORIDE 0.9 % IV SOLN: INTRAVENOUS | Status: DC

## 2017-08-21 NOTE — Op Note (Signed)
[facility] Gastroenterology Patient Name: Katherine Gallegos Procedure Date: 08/21/2017 11:12 AM MRN: [id number] Account #: [id number] Date of Birth: [birth year] Admit Type: Outpatient Age: 77 Room: [facility] ENDO ROOM 3 Gender: Female Note Status: Finalized Procedure:            Upper GI endoscopy Indications:          Dysphagia Providers:            Manya Silvas, MD Referring MD:         Irven Easterly. Kary Kos, MD (Referring MD) Medicines:            Propofol per Anesthesia Complications:        No immediate complications. Procedure:            Pre-Anesthesia Assessment:                       - After reviewing the risks and benefits, the patient                        was deemed in satisfactory condition to undergo the                        procedure.                       After obtaining informed consent, the endoscope was                        passed under direct vision. Throughout the procedure,                        the patient's blood pressure, pulse, and oxygen                        saturations were monitored continuously. The Endoscope                        was introduced through the mouth, and advanced to the                        second part of duodenum. The upper GI endoscopy was                        accomplished without difficulty. The patient tolerated                        the procedure well. Findings:      A mild Schatzki ring was found at the gastroesophageal junction. At the       end of the test A TTS dilator was passed through the scope. Dilation       with an 18-19-20 mm balloon dilator was performed to 20 mm. Exam of the       distal esophagus showed the ring was dilated but not fractured.      Localized mild inflammation characterized by erosions, erythema and       granularity was found in the prepyloric region of the stomach. Biopsies       were taken with a cold forceps for histology. Biopsies were taken with a       cold forceps for  Helicobacter pylori testing.      The  examined duodenum was normal. Impression:           - Mild Schatzki ring. Dilated.                       - Gastritis. Biopsied.                       - Normal examined duodenum. Recommendation:       - Await pathology results. Eat slowly, chew well, take                        small bites. Manya Silvas, MD 08/21/2017 11:41:08 AM This report has been signed electronically. Number of Addenda: 0 Note Initiated On: 08/21/2017 11:12 AM      [facility]

## 2017-08-21 NOTE — Anesthesia Preprocedure Evaluation (Signed)
Anesthesia Evaluation  Patient identified by MRN, date of birth, ID band Patient awake    Reviewed: Allergy & Precautions, H&P , NPO status , Patient's Chart, lab work & pertinent test results, reviewed documented beta blocker date and time   Airway Mallampati: II   Neck ROM: full    Dental  (+) Poor Dentition   Pulmonary neg pulmonary ROS,    Pulmonary exam normal        Cardiovascular Exercise Tolerance: Poor hypertension, On Medications negative cardio ROS Normal cardiovascular exam Rhythm:regular Rate:Normal     Neuro/Psych negative neurological ROS  negative psych ROS   GI/Hepatic negative GI ROS, Neg liver ROS,   Endo/Other  negative endocrine ROS  Renal/GU negative Renal ROS  negative genitourinary   Musculoskeletal   Abdominal   Peds  Hematology negative hematology ROS (+)   Anesthesia Other Findings Past Medical History: No date: Allergic state No date: Arthritis No date: FH: colon polyps No date: Hypertension No date: Skin cancer Past Surgical History: 1977: ABDOMINAL HYSTERECTOMY     Comment:  Partial 1977: APPENDECTOMY 20 + yrs : BREAST BIOPSY; Bilateral     Comment:  excisional bx. negative results 04/02/2016: BREAST BIOPSY; Left     Comment:  benign-FIBROCYSTIC CHANGE 2014: COLON SURGERY     Comment:  Large Polypectomy with Bowel Resection (Dr. Rochel Brome) No date: COLONOSCOPY WITH ESOPHAGOGASTRODUODENOSCOPY (EGD) 06/13/2016: COLONOSCOPY WITH PROPOFOL; N/A     Comment:  Procedure: COLONOSCOPY WITH PROPOFOL; adenomatous polyps              Surgeon: Manya Silvas, MD;  Location: [facility] ENDOSCOPY;              Service: Endoscopy;  Laterality: N/A; 2010: VEIN SURGERY BMI    Body Mass Index:  23.91 kg/m     Reproductive/Obstetrics negative OB ROS                             Anesthesia Physical Anesthesia Plan  ASA: III  Anesthesia Plan:  General   Post-op Pain Management:    Induction:   PONV Risk Score and Plan:   Airway Management Planned:   Additional Equipment:   Intra-op Plan:   Post-operative Plan:   Informed Consent: I have reviewed the patients History and Physical, chart, labs and discussed the procedure including the risks, benefits and alternatives for the proposed anesthesia with the patient or authorized representative who has indicated his/her understanding and acceptance.   Dental Advisory Given  Plan Discussed with: CRNA  Anesthesia Plan Comments:         Anesthesia Quick Evaluation

## 2017-08-21 NOTE — Anesthesia Post-op Follow-up Note (Signed)
Anesthesia QCDR form completed.        

## 2017-08-21 NOTE — Transfer of Care (Addendum)
Immediate Anesthesia Transfer of Care Note  Patient: Katherine Gallegos  Procedure(s) Performed: ESOPHAGOGASTRODUODENOSCOPY (EGD) WITH PROPOFOL (N/A )  Patient Location: PACU  Anesthesia Type:General  Level of Consciousness: awake  Airway & Oxygen Therapy: Patient Spontanous Breathing  Post-op Assessment: Report given to RN  Post vital signs: Reviewed and stable  Last Vitals:  Vitals Value Taken Time  BP    Temp    Pulse    Resp    SpO2      Last Pain:  Vitals:   08/21/17 1040  TempSrc: Tympanic  PainSc: 0-No pain         Complications: No apparent anesthesia complications

## 2017-08-21 NOTE — H&P (Signed)
Primary Care Physician:  Maryland Pink, MD Primary Gastroenterologist:  Dr. Vira Agar  Pre-Procedure History & Physical: HPI:  Katherine Gallegos is a 77 y.o. female is here for an endoscopy. Test for dysphagia.   Past Medical History:  Diagnosis Date  . Allergic state   . Arthritis   . FH: colon polyps   . Hypertension   . Skin cancer     Past Surgical History:  Procedure Laterality Date  . ABDOMINAL HYSTERECTOMY  1977   Partial  . APPENDECTOMY  1977  . BREAST BIOPSY Bilateral 20 + yrs    excisional bx. negative results  . BREAST BIOPSY Left 04/02/2016   benign-FIBROCYSTIC CHANGE  . COLON SURGERY  2014   Large Polypectomy with Bowel Resection (Dr. Rochel Brome)  . COLONOSCOPY WITH ESOPHAGOGASTRODUODENOSCOPY (EGD)    . COLONOSCOPY WITH PROPOFOL N/A 06/13/2016   Procedure: COLONOSCOPY WITH PROPOFOL; adenomatous polyps Surgeon: Manya Silvas, MD;  Location: [facility] ENDOSCOPY;  Service: Endoscopy;  Laterality: N/A;  . VEIN SURGERY  2010    Prior to Admission medications   Medication Sig Start Date End Date Taking? Authorizing Provider  acetaminophen (TYLENOL) 325 MG tablet Take 650 mg by mouth every 6 (six) hours as needed.   Yes [provider]  amLODipine (NORVASC) 5 MG tablet Take 7.5 mg by mouth daily.   Yes [provider]  metoprolol tartrate (LOPRESSOR) 25 MG tablet Take 25 mg by mouth 2 (two) times daily.   Yes [provider]  conjugated estrogens (PREMARIN) vaginal cream Place 1 Applicatorful vaginally daily. Apply 0.5mg  (pea-sized amount)  just inside the vaginal introitus with a finger-tip every night for two weeks and then Monday, Wednesday and Friday nights. Patient not taking: Reported on 08/21/2017 02/13/17   Zara Council A, PA-C    Allergies as of 08/20/2017 - Review Complete 08/20/2017  Allergen Reaction Noted  . Hydrochlorothiazide Rash 06/12/2016  . Latex Rash 06/12/2016  . Lovastatin Other (See Comments) 06/12/2016     Family History  Problem Relation Age of Onset  . Cancer Sister        bone ca.   . Breast cancer Sister   . Hematuria Neg Hx   . Kidney cancer Neg Hx   . Kidney disease Neg Hx   . Prostate cancer Neg Hx   . Sickle cell trait Neg Hx   . Tuberculosis Neg Hx     Social History   Socioeconomic History  . Marital status: Married    Spouse name: Not on file  . Number of children: Not on file  . Years of education: Not on file  . Highest education level: Not on file  Occupational History  . Not on file  Social Needs  . Financial resource strain: Not on file  . Food insecurity:    Worry: Not on file    Inability: Not on file  . Transportation needs:    Medical: Not on file    Non-medical: Not on file  Tobacco Use  . Smoking status: Never Smoker  . Smokeless tobacco: Never Used  Substance and Sexual Activity  . Alcohol use: No  . Drug use: No  . Sexual activity: Never  Lifestyle  . Physical activity:    Days per week: Not on file    Minutes per session: Not on file  . Stress: Not on file  Relationships  . Social connections:    Talks on phone: Not on file    Gets together: Not on  file    Attends religious service: Not on file    Active member of club or organization: Not on file    Attends meetings of clubs or organizations: Not on file    Relationship status: Not on file  . Intimate partner violence:    Fear of current or ex partner: Not on file    Emotionally abused: Not on file    Physically abused: Not on file    Forced sexual activity: Not on file  Other Topics Concern  . Not on file  Social History Narrative  . Not on file    Review of Systems: See HPI, otherwise negative ROS  Physical Exam: BP 113/68   Pulse 82   Temp 98.6 F (37 C)   Resp 20   Ht 5\' 3"  (1.6 m)   Wt 61.2 kg (135 lb)   SpO2 99%   BMI 23.91 kg/m  General:   Alert,  pleasant and cooperative in NAD Head:  Normocephalic and atraumatic. Neck:  Supple; no masses or  thyromegaly. Lungs:  Clear throughout to auscultation.    Heart:  Regular rate and rhythm. Abdomen:  Soft, nontender and nondistended. Normal bowel sounds, without guarding, and without rebound.   Neurologic:  Alert and  oriented x4;  grossly normal neurologically.  Impression/Plan: Katherine Gallegos is here for an endoscopy to be performed for dysphagia.  Risks, benefits, limitations, and alternatives regarding  endoscopy have been reviewed with the patient.  Questions have been answered.  All parties agreeable.   Gaylyn Cheers, MD  08/21/2017, 11:16 AM

## 2017-08-22 ENCOUNTER — Encounter: Payer: Self-pay | Admitting: Unknown Physician Specialty

## 2017-08-22 LAB — SURGICAL PATHOLOGY

## 2017-08-26 NOTE — Anesthesia Postprocedure Evaluation (Signed)
Anesthesia Post Note  Patient: Katherine Gallegos  Procedure(s) Performed: ESOPHAGOGASTRODUODENOSCOPY (EGD) WITH PROPOFOL (N/A )  Patient location during evaluation: PACU Anesthesia Type: General Level of consciousness: awake and alert Pain management: pain level controlled Vital Signs Assessment: post-procedure vital signs reviewed and stable Respiratory status: spontaneous breathing, nonlabored ventilation, respiratory function stable and patient connected to nasal cannula oxygen Cardiovascular status: blood pressure returned to baseline and stable Postop Assessment: no apparent nausea or vomiting Anesthetic complications: no     Last Vitals:  Vitals:   08/21/17 1152 08/21/17 1202  BP: 133/76 (!) 146/75  Pulse: 75 74  Resp: 17 13  Temp:    SpO2: 97% 100%    Last Pain:  Vitals:   08/22/17 0750  TempSrc:   PainSc: 0-No pain                 Molli Barrows

## 2017-09-03 DIAGNOSIS — E785 Hyperlipidemia, unspecified: Secondary | ICD-10-CM | POA: Diagnosis not present

## 2017-09-03 DIAGNOSIS — K2970 Gastritis, unspecified, without bleeding: Secondary | ICD-10-CM | POA: Diagnosis not present

## 2017-09-03 DIAGNOSIS — Z0000 Encounter for general adult medical examination without abnormal findings: Secondary | ICD-10-CM | POA: Diagnosis not present

## 2017-09-03 DIAGNOSIS — I10 Essential (primary) hypertension: Secondary | ICD-10-CM | POA: Diagnosis not present

## 2017-09-03 DIAGNOSIS — N951 Menopausal and female climacteric states: Secondary | ICD-10-CM | POA: Diagnosis not present

## 2017-09-11 DIAGNOSIS — M8588 Other specified disorders of bone density and structure, other site: Secondary | ICD-10-CM | POA: Diagnosis not present

## 2017-12-10 DIAGNOSIS — D2272 Melanocytic nevi of left lower limb, including hip: Secondary | ICD-10-CM | POA: Diagnosis not present

## 2017-12-10 DIAGNOSIS — L821 Other seborrheic keratosis: Secondary | ICD-10-CM | POA: Diagnosis not present

## 2017-12-10 DIAGNOSIS — Z808 Family history of malignant neoplasm of other organs or systems: Secondary | ICD-10-CM | POA: Diagnosis not present

## 2017-12-10 DIAGNOSIS — L219 Seborrheic dermatitis, unspecified: Secondary | ICD-10-CM | POA: Diagnosis not present

## 2017-12-10 DIAGNOSIS — Z1283 Encounter for screening for malignant neoplasm of skin: Secondary | ICD-10-CM | POA: Diagnosis not present

## 2017-12-10 DIAGNOSIS — Z85828 Personal history of other malignant neoplasm of skin: Secondary | ICD-10-CM | POA: Diagnosis not present

## 2017-12-10 DIAGNOSIS — L905 Scar conditions and fibrosis of skin: Secondary | ICD-10-CM | POA: Diagnosis not present

## 2017-12-10 DIAGNOSIS — I781 Nevus, non-neoplastic: Secondary | ICD-10-CM | POA: Diagnosis not present

## 2017-12-10 DIAGNOSIS — L820 Inflamed seborrheic keratosis: Secondary | ICD-10-CM | POA: Diagnosis not present

## 2017-12-10 DIAGNOSIS — D1800 Hemangioma unspecified site: Secondary | ICD-10-CM | POA: Diagnosis not present

## 2017-12-10 DIAGNOSIS — D224 Melanocytic nevi of scalp and neck: Secondary | ICD-10-CM | POA: Diagnosis not present

## 2017-12-10 DIAGNOSIS — I8393 Asymptomatic varicose veins of bilateral lower extremities: Secondary | ICD-10-CM | POA: Diagnosis not present

## 2018-02-10 DIAGNOSIS — L821 Other seborrheic keratosis: Secondary | ICD-10-CM | POA: Diagnosis not present

## 2018-02-10 DIAGNOSIS — D1800 Hemangioma unspecified site: Secondary | ICD-10-CM | POA: Diagnosis not present

## 2018-02-10 DIAGNOSIS — L820 Inflamed seborrheic keratosis: Secondary | ICD-10-CM | POA: Diagnosis not present

## 2018-03-05 DIAGNOSIS — I10 Essential (primary) hypertension: Secondary | ICD-10-CM | POA: Diagnosis not present

## 2018-03-05 DIAGNOSIS — F4321 Adjustment disorder with depressed mood: Secondary | ICD-10-CM | POA: Diagnosis not present

## 2018-05-08 ENCOUNTER — Other Ambulatory Visit: Payer: Self-pay | Admitting: Family Medicine

## 2018-06-02 DIAGNOSIS — M5441 Lumbago with sciatica, right side: Secondary | ICD-10-CM | POA: Diagnosis not present

## 2018-06-02 DIAGNOSIS — R8290 Unspecified abnormal findings in urine: Secondary | ICD-10-CM | POA: Diagnosis not present

## 2018-06-02 DIAGNOSIS — I10 Essential (primary) hypertension: Secondary | ICD-10-CM | POA: Diagnosis not present

## 2018-09-23 DIAGNOSIS — I10 Essential (primary) hypertension: Secondary | ICD-10-CM | POA: Diagnosis not present

## 2018-09-25 DIAGNOSIS — I10 Essential (primary) hypertension: Secondary | ICD-10-CM | POA: Diagnosis not present

## 2018-09-25 DIAGNOSIS — F4321 Adjustment disorder with depressed mood: Secondary | ICD-10-CM | POA: Diagnosis not present

## 2018-09-25 DIAGNOSIS — I83813 Varicose veins of bilateral lower extremities with pain: Secondary | ICD-10-CM | POA: Diagnosis not present

## 2018-09-25 DIAGNOSIS — Z0000 Encounter for general adult medical examination without abnormal findings: Secondary | ICD-10-CM | POA: Diagnosis not present

## 2018-10-07 ENCOUNTER — Other Ambulatory Visit: Payer: Self-pay | Admitting: Family Medicine

## 2018-10-07 DIAGNOSIS — Z1231 Encounter for screening mammogram for malignant neoplasm of breast: Secondary | ICD-10-CM

## 2018-10-21 ENCOUNTER — Ambulatory Visit (INDEPENDENT_AMBULATORY_CARE_PROVIDER_SITE_OTHER): Payer: PPO | Admitting: Vascular Surgery

## 2018-10-21 ENCOUNTER — Encounter (INDEPENDENT_AMBULATORY_CARE_PROVIDER_SITE_OTHER): Payer: Self-pay

## 2018-10-21 ENCOUNTER — Encounter (INDEPENDENT_AMBULATORY_CARE_PROVIDER_SITE_OTHER): Payer: Self-pay | Admitting: Vascular Surgery

## 2018-10-21 ENCOUNTER — Other Ambulatory Visit: Payer: Self-pay

## 2018-10-21 DIAGNOSIS — M7989 Other specified soft tissue disorders: Secondary | ICD-10-CM

## 2018-10-21 DIAGNOSIS — Z79899 Other long term (current) drug therapy: Secondary | ICD-10-CM

## 2018-10-21 DIAGNOSIS — I83813 Varicose veins of bilateral lower extremities with pain: Secondary | ICD-10-CM | POA: Diagnosis not present

## 2018-10-21 DIAGNOSIS — I10 Essential (primary) hypertension: Secondary | ICD-10-CM | POA: Insufficient documentation

## 2018-10-21 NOTE — Assessment & Plan Note (Signed)
blood pressure control important in reducing the progression of atherosclerotic disease. On appropriate oral medications.  

## 2018-10-21 NOTE — Assessment & Plan Note (Signed)
Venous disease is likely a major contributing factor and potentially the primary contributing factor.  May have a component of lymphedema from chronic swelling and scarring and lymphatic channels.  May also have some side effect of amlodipine.

## 2018-10-21 NOTE — Patient Instructions (Signed)

## 2018-10-21 NOTE — Progress Notes (Signed)
Patient ID: Katherine Gallegos, female   DOB: 01/09/1941, 78 y.o.   MRN: [id number]  Chief Complaint  Patient presents with   New Patient (Initial Visit)    HPI Katherine Gallegos is a 78 y.o. female.  I am asked to see the patient by Dr. Kary Kos for evaluation of painful varicose veins.  The patient presents with complaints of symptomatic varicosities of the lower extremities. The patient reports a long standing history of varicosities and they have become painful over time.  About 8 to 10 years ago, she underwent what sounds like laser ablation of both great saphenous veins by vascular surgeon no longer in the medical community.  She initially did fairly well from this but over the next few years, the veins began returning.  There was no clear inciting event or causative factor that started the symptoms.  She says she for started noticing veins as a teenager.  There is no trauma or injury.  The left leg is more severly affected. The patient elevates the legs for relief. The pain is described as heaviness and aching in the leg. The symptoms are generally most severe in the evening, particularly when they have been on their feet for long periods of time.  Compression stockings and elevation have been used to try to improve the symptoms with some success. The patient complains of worsening swelling as an associated symptom, particularly in the left leg. The patient has no previous history of deep venous thrombosis or superficial thrombophlebitis to their knowledge.     Past Medical History:  Diagnosis Date   Allergic state    Arthritis    FH: colon polyps    Hypertension    Skin cancer     Past Surgical History:  Procedure Laterality Date   ABDOMINAL HYSTERECTOMY  1977   Partial   APPENDECTOMY  1977   BREAST BIOPSY Bilateral 20 + yrs    excisional bx. negative results   BREAST BIOPSY Left 04/02/2016   benign-FIBROCYSTIC CHANGE   COLON SURGERY  2014   Large Polypectomy with  Bowel Resection (Dr. Rochel Brome)   COLONOSCOPY WITH ESOPHAGOGASTRODUODENOSCOPY (EGD)     COLONOSCOPY WITH PROPOFOL N/A 06/13/2016   Procedure: COLONOSCOPY WITH PROPOFOL; adenomatous polyps Surgeon: Manya Silvas, MD;  Location: [place];  Service: Endoscopy;  Laterality: N/A;   ESOPHAGOGASTRODUODENOSCOPY (EGD) WITH PROPOFOL N/A 08/21/2017   Procedure: ESOPHAGOGASTRODUODENOSCOPY (EGD) WITH PROPOFOL;  Surgeon: Manya Silvas, MD;  Location: [facility] ENDOSCOPY;  Service: Endoscopy;  Laterality: N/A;   VEIN SURGERY  2010    Family History  Problem Relation Age of Onset   Cancer Sister        bone ca.    Breast cancer Sister    Hematuria Neg Hx    Kidney cancer Neg Hx    Kidney disease Neg Hx    Prostate cancer Neg Hx    Sickle cell trait Neg Hx    Tuberculosis Neg Hx      Social History Social History   Tobacco Use   Smoking status: Never Smoker   Smokeless tobacco: Never Used  Substance Use Topics   Alcohol use: No   Drug use: No     Allergies  Allergen Reactions   Hydrochlorothiazide Rash   Latex Rash   Lovastatin Other (See Comments)    Muscle pain    Current Outpatient Medications  Medication Sig Dispense Refill   acetaminophen (TYLENOL) 325 MG tablet Take 650 mg by mouth every 6 (  six) hours as needed.     amLODipine (NORVASC) 5 MG tablet Take 7.5 mg by mouth daily.     metoprolol tartrate (LOPRESSOR) 25 MG tablet Take 25 mg by mouth 2 (two) times daily.     conjugated estrogens (PREMARIN) vaginal cream Place 1 Applicatorful vaginally daily. Apply 0.5mg  (pea-sized amount)  just inside the vaginal introitus with a finger-tip every night for two weeks and then Monday, Wednesday and Friday nights. (Patient not taking: Reported on 08/21/2017) 30 g 12   No current facility-administered medications for this visit.       REVIEW OF SYSTEMS (Negative unless checked)  Constitutional: [] Weight loss  [] Fever  [] Chills Cardiac: [] Chest pain    [] Chest pressure   [] Palpitations   [] Shortness of breath when laying flat   [] Shortness of breath at rest   [] Shortness of breath with exertion. Vascular:  [] Pain in legs with walking   [] Pain in legs at rest   [] Pain in legs when laying flat   [] Claudication   [] Pain in feet when walking  [] Pain in feet at rest  [] Pain in feet when laying flat   [] History of DVT   [] Phlebitis   [x] Swelling in legs   [x] Varicose veins   [] Non-healing ulcers Pulmonary:   [] Uses home oxygen   [] Productive cough   [] Hemoptysis   [] Wheeze  [] COPD   [] Asthma Neurologic:  [] Dizziness  [] Blackouts   [] Seizures   [] History of stroke   [] History of TIA  [] Aphasia   [] Temporary blindness   [] Dysphagia   [] Weakness or numbness in arms   [] Weakness or numbness in legs Musculoskeletal:  [x] Arthritis   [] Joint swelling   [] Joint pain   [] Low back pain Hematologic:  [] Easy bruising  [] Easy bleeding   [] Hypercoagulable state   [] Anemic  [] Hepatitis Gastrointestinal:  [] Blood in stool   [] Vomiting blood  [] Gastroesophageal reflux/heartburn   [] Abdominal pain Genitourinary:  [] Chronic kidney disease   [] Difficult urination  [] Frequent urination  [] Burning with urination   [] Hematuria Skin:  [] Rashes   [] Ulcers   [] Wounds Psychological:  [] History of anxiety   []  History of major depression.    Physical Exam BP (!) 147/81 (BP Location: Left Arm, Patient Position: Sitting, Cuff Size: Normal)    Pulse 66    Resp 12    Ht 5\' 2"  (1.575 m)    Wt 145 lb (65.8 kg)    BMI 26.52 kg/m  Gen:  WD/WN, NAD Head: [place]/AT, No temporalis wasting.  Ear/Nose/Throat: Hearing grossly intact, oropharynx clear Eyes: Sclera non-icteric. Conjunctiva clear Neck: Supple. Trachea midline Pulmonary:  Good air movement, no use of accessory muscles, respirations not labored.  Cardiac: RRR, No JVD Vascular: Varicosities diffuse and measuring up to 2 mm in the right lower extremity        Varicosities extensive and measuring up to 3 mm in the left lower  extremity Vessel Right Left  Radial Palpable Palpable                          PT Palpable  1+ palpable  DP Palpable Palpable   Gastrointestinal: soft, non-tender/non-distended.  Musculoskeletal: M/S 5/5 throughout.  Trace RLE edema.  1 + LLE edema Neurologic: Sensation grossly intact in extremities.  Symmetrical.  Speech is fluent.  Psychiatric: Judgment intact, Mood & affect appropriate for pt's clinical situation. Dermatologic: No rashes or ulcers noted.  No cellulitis or open wounds.    Radiology No results found.  Labs No results  found for this or any previous visit (from the past 2160 hour(s)).  Assessment/Plan:  Essential hypertension blood pressure control important in reducing the progression of atherosclerotic disease. On appropriate oral medications.   Swelling of limb Venous disease is likely a major contributing factor and potentially the primary contributing factor.  May have a component of lymphedema from chronic swelling and scarring and lymphatic channels.  May also have some side effect of amlodipine.  Varicose veins of leg with pain, bilateral   The patient has symptoms consistent with chronic venous insufficiency. We discussed the natural history and treatment options for venous disease. I recommended the regular use of 20 - 30 mm Hg compression stockings, and prescribed these today. I recommended leg elevation and anti-inflammatories as needed for pain. I have also recommended a complete venous duplex to assess the venous system for reflux or thrombotic issues. This can be done at the patient's convenience. I will see the patient back after the duplex to assess the response to conservative management, and determine further treatment options.     Leotis Pain 10/21/2018, 9:30 AM   This note was created with Dragon medical transcription system.  Any errors from dictation are unintentional.

## 2018-10-29 ENCOUNTER — Other Ambulatory Visit: Payer: Self-pay

## 2018-10-29 ENCOUNTER — Ambulatory Visit (INDEPENDENT_AMBULATORY_CARE_PROVIDER_SITE_OTHER): Payer: PPO

## 2018-10-29 ENCOUNTER — Encounter (INDEPENDENT_AMBULATORY_CARE_PROVIDER_SITE_OTHER): Payer: Self-pay | Admitting: Nurse Practitioner

## 2018-10-29 ENCOUNTER — Ambulatory Visit (INDEPENDENT_AMBULATORY_CARE_PROVIDER_SITE_OTHER): Payer: PPO | Admitting: Nurse Practitioner

## 2018-10-29 VITALS — BP 145/76 | HR 65 | Resp 16 | Ht 62.0 in | Wt 144.8 lb

## 2018-10-29 DIAGNOSIS — I83813 Varicose veins of bilateral lower extremities with pain: Secondary | ICD-10-CM

## 2018-10-29 DIAGNOSIS — M7989 Other specified soft tissue disorders: Secondary | ICD-10-CM

## 2018-10-29 DIAGNOSIS — I10 Essential (primary) hypertension: Secondary | ICD-10-CM

## 2018-11-06 ENCOUNTER — Encounter (INDEPENDENT_AMBULATORY_CARE_PROVIDER_SITE_OTHER): Payer: Self-pay | Admitting: Nurse Practitioner

## 2018-11-06 DIAGNOSIS — M5442 Lumbago with sciatica, left side: Secondary | ICD-10-CM | POA: Diagnosis not present

## 2018-11-06 DIAGNOSIS — M545 Low back pain: Secondary | ICD-10-CM | POA: Diagnosis not present

## 2018-11-06 DIAGNOSIS — I10 Essential (primary) hypertension: Secondary | ICD-10-CM | POA: Diagnosis not present

## 2018-11-06 DIAGNOSIS — M5441 Lumbago with sciatica, right side: Secondary | ICD-10-CM | POA: Diagnosis not present

## 2018-11-06 NOTE — Progress Notes (Signed)
SUBJECTIVE:  Patient ID: Katherine Gallegos, female    DOB: 04/08/1940, 78 y.o.   MRN: [id number] Chief Complaint  Patient presents with  . Follow-up    ultrasound follow up    HPI  Katherine Gallegos is a 78 y.o. female that presents today with painful varicose veins.  She has complaints of symptomatic varicose veins.  She states that as far swelling was her most concerning areas her left ankle.  However, the patient is currently on amlodipine and this could be contributing somewhat to her swelling in her ankles.  The patient reports approximately 8 to 10 years ago she had what sounds like an endovenous laser ablation, not done by our office.  Initially this worked well however lately she has been having more pain and swelling.  She states that elevation does porting relief as well as it helps with the heaviness and aching that she feels.  The pain is mostly in the evenings.  Compression stockings and elevation do improve the symptoms somewhat.  Today the patient underwent noninvasive studies which revealed reflux within the right great saphenous vein.  The left lower extremity has reflux within the common femoral vein.  There is no evidence of DVT or superficial venous thrombosis bilaterally.  Past Medical History:  Diagnosis Date  . Allergic state   . Arthritis   . FH: colon polyps   . Hypertension   . Skin cancer     Past Surgical History:  Procedure Laterality Date  . ABDOMINAL HYSTERECTOMY  1977   Partial  . APPENDECTOMY  1977  . BREAST BIOPSY Bilateral 20 + yrs    excisional bx. negative results  . BREAST BIOPSY Left 04/02/2016   benign-FIBROCYSTIC CHANGE  . COLON SURGERY  2014   Large Polypectomy with Bowel Resection (Dr. Rochel Brome)  . COLONOSCOPY WITH ESOPHAGOGASTRODUODENOSCOPY (EGD)    . COLONOSCOPY WITH PROPOFOL N/A 06/13/2016   Procedure: COLONOSCOPY WITH PROPOFOL; adenomatous polyps Surgeon: Manya Silvas, MD;  Location: [facility] ENDOSCOPY;  Service: Endoscopy;   Laterality: N/A;  . ESOPHAGOGASTRODUODENOSCOPY (EGD) WITH PROPOFOL N/A 08/21/2017   Procedure: ESOPHAGOGASTRODUODENOSCOPY (EGD) WITH PROPOFOL;  Surgeon: Manya Silvas, MD;  Location: [facility] ENDOSCOPY;  Service: Endoscopy;  Laterality: N/A;  . VEIN SURGERY  2010    Social History   Socioeconomic History  . Marital status: Married    Spouse name: Not on file  . Number of children: Not on file  . Years of education: Not on file  . Highest education level: Not on file  Occupational History  . Not on file  Social Needs  . Financial resource strain: Not on file  . Food insecurity    Worry: Not on file    Inability: Not on file  . Transportation needs    Medical: Not on file    Non-medical: Not on file  Tobacco Use  . Smoking status: Never Smoker  . Smokeless tobacco: Never Used  Substance and Sexual Activity  . Alcohol use: No  . Drug use: No  . Sexual activity: Never  Lifestyle  . Physical activity    Days per week: Not on file    Minutes per session: Not on file  . Stress: Not on file  Relationships  . Social Herbalist on phone: Not on file    Gets together: Not on file    Attends religious service: Not on file    Active member of club or organization: Not on file  Attends meetings of clubs or organizations: Not on file    Relationship status: Not on file  . Intimate partner violence    Fear of current or ex partner: Not on file    Emotionally abused: Not on file    Physically abused: Not on file    Forced sexual activity: Not on file  Other Topics Concern  . Not on file  Social History Narrative  . Not on file    Family History  Problem Relation Age of Onset  . Cancer Sister        bone ca.   . Breast cancer Sister   . Hematuria Neg Hx   . Kidney cancer Neg Hx   . Kidney disease Neg Hx   . Prostate cancer Neg Hx   . Sickle cell trait Neg Hx   . Tuberculosis Neg Hx     Allergies  Allergen Reactions  . Hydrochlorothiazide Rash  . Latex  Rash  . Lovastatin Other (See Comments)    Muscle pain     Review of Systems   Review of Systems: Negative Unless Checked Constitutional: [] Weight loss  [] Fever  [] Chills Cardiac: [] Chest pain   []  Atrial Fibrillation  [] Palpitations   [] Shortness of breath when laying flat   [] Shortness of breath with exertion. [] Shortness of breath at rest Vascular:  [] Pain in legs with walking   [] Pain in legs with standing [] Pain in legs when laying flat   [] Claudication    [] Pain in feet when laying flat    [] History of DVT   [] Phlebitis   [x] Swelling in legs   [x] Varicose veins   [] Non-healing ulcers Pulmonary:   [] Uses home oxygen   [] Productive cough   [] Hemoptysis   [] Wheeze  [] COPD   [] Asthma Neurologic:  [] Dizziness   [] Seizures  [] Blackouts [] History of stroke   [] History of TIA  [] Aphasia   [] Temporary Blindness   [] Weakness or numbness in arm   [] Weakness or numbness in leg Musculoskeletal:   [] Joint swelling   [] Joint pain   [] Low back pain  []  History of Knee Replacement [] Arthritis [] back Surgeries  []  Spinal Stenosis    Hematologic:  [] Easy bruising  [] Easy bleeding   [] Hypercoagulable state   [] Anemic Gastrointestinal:  [] Diarrhea   [] Vomiting  [] Gastroesophageal reflux/heartburn   [] Difficulty swallowing. [] Abdominal pain Genitourinary:  [] Chronic kidney disease   [] Difficult urination  [] Anuric   [] Blood in urine [] Frequent urination  [] Burning with urination   [] Hematuria Skin:  [] Rashes   [] Ulcers [] Wounds Psychological:  [] History of anxiety   []  History of major depression  []  Memory Difficulties      OBJECTIVE:   Physical Exam  BP (!) 145/76 (BP Location: Right Arm)   Pulse 65   Resp 16   Ht 5\' 2"  (1.575 m)   Wt 144 lb 12.8 oz (65.7 kg)   BMI 26.48 kg/m   Gen: WD/WN, NAD Head: [place]/AT, No temporalis wasting.  Ear/Nose/Throat: Hearing grossly intact, nares w/o erythema or drainage Eyes: PER, EOMI, sclera nonicteric.  Neck: Supple, no masses.  No JVD.  Pulmonary:  Good air  movement, no use of accessory muscles.  Cardiac: RRR Vascular: scattered varicosities present bilaterally.  Mild venous stasis changes to the legs bilaterally.  2+ soft pitting edema  Vessel Right Left  Radial Palpable Palpable  Dorsalis Pedis Palpable Palpable  Posterior Tibial Palpable Palpable   Gastrointestinal: soft, non-distended. No guarding/no peritoneal signs.  Musculoskeletal: M/S 5/5 throughout.  No deformity or atrophy.  Neurologic: Pain  and light touch intact in extremities.  Symmetrical.  Speech is fluent. Motor exam as listed above. Psychiatric: Judgment intact, Mood & affect appropriate for pt's clinical situation. Dermatologic: No Venous rashes. No Ulcers Noted.  No changes consistent with cellulitis. Lymph : No Cervical lymphadenopathy, no lichenification or skin changes of chronic lymphedema.       ASSESSMENT AND PLAN:  1. Varicose veins of leg with pain, bilateral Today I discussed with the patient the option of redoing an endovenous laser ablation on her right lower extremity as well as the possibility of a lymphedema pump to control swelling especially since her left lower extremity tends to be the one suffering more.  At this time the patient wishes to continue with conservative therapy which includes wearing medical grade 1 compression stockings on a daily basis.  Elevating her lower extremities when not active.  She also is going to utilize NSAIDs for pain relief as well as frequent exercise.  She is also planning on talking with her PCP about possibly switching some of her hypertension medications which may be contributing some her swelling.  We will see the patient back in 6 months.  2. Swelling of limb See #1  3. Essential hypertension Continue antihypertensive medications as already ordered, these medications have been reviewed and there are no changes at this time.    Current Outpatient Medications on File Prior to Visit  Medication Sig Dispense Refill   . acetaminophen (TYLENOL) 325 MG tablet Take 650 mg by mouth every 6 (six) hours as needed.    Marland Kitchen amLODipine (NORVASC) 5 MG tablet Take 7.5 mg by mouth daily.    . metoprolol tartrate (LOPRESSOR) 25 MG tablet Take 25 mg by mouth 2 (two) times daily.    Marland Kitchen conjugated estrogens (PREMARIN) vaginal cream Place 1 Applicatorful vaginally daily. Apply 0.5mg  (pea-sized amount)  just inside the vaginal introitus with a finger-tip every night for two weeks and then Monday, Wednesday and Friday nights. (Patient not taking: Reported on 08/21/2017) 30 g 12   No current facility-administered medications on file prior to visit.     There are no Patient Instructions on file for this visit. No follow-ups on file.   Kris Hartmann, NP  This note was completed with Sales executive.  Any errors are purely unintentional.

## 2018-12-16 ENCOUNTER — Ambulatory Visit
Admission: RE | Admit: 2018-12-16 | Discharge: 2018-12-16 | Disposition: A | Discharge status: Home / Self Care | Payer: PPO | Source: Ambulatory Visit | Attending: Family Medicine | Admitting: Family Medicine

## 2018-12-16 DIAGNOSIS — Z1231 Encounter for screening mammogram for malignant neoplasm of breast: Secondary | ICD-10-CM | POA: Insufficient documentation

## 2018-12-23 DIAGNOSIS — H40033 Anatomical narrow angle, bilateral: Secondary | ICD-10-CM | POA: Diagnosis not present

## 2019-03-03 DIAGNOSIS — M47816 Spondylosis without myelopathy or radiculopathy, lumbar region: Secondary | ICD-10-CM | POA: Diagnosis not present

## 2019-03-03 DIAGNOSIS — M545 Low back pain: Secondary | ICD-10-CM | POA: Diagnosis not present

## 2019-03-03 DIAGNOSIS — G8929 Other chronic pain: Secondary | ICD-10-CM | POA: Diagnosis not present

## 2019-04-14 DIAGNOSIS — E785 Hyperlipidemia, unspecified: Secondary | ICD-10-CM | POA: Diagnosis not present

## 2019-05-01 ENCOUNTER — Other Ambulatory Visit: Payer: Self-pay

## 2019-05-01 ENCOUNTER — Ambulatory Visit (INDEPENDENT_AMBULATORY_CARE_PROVIDER_SITE_OTHER): Payer: PPO | Admitting: Vascular Surgery

## 2019-05-01 ENCOUNTER — Encounter (INDEPENDENT_AMBULATORY_CARE_PROVIDER_SITE_OTHER): Payer: Self-pay | Admitting: Vascular Surgery

## 2019-05-01 VITALS — BP 153/78 | HR 71 | Resp 16 | Ht 63.0 in | Wt 152.0 lb

## 2019-05-01 DIAGNOSIS — M7989 Other specified soft tissue disorders: Secondary | ICD-10-CM | POA: Diagnosis not present

## 2019-05-01 DIAGNOSIS — T7840XA Allergy, unspecified, initial encounter: Secondary | ICD-10-CM | POA: Insufficient documentation

## 2019-05-01 DIAGNOSIS — I83813 Varicose veins of bilateral lower extremities with pain: Secondary | ICD-10-CM

## 2019-05-01 DIAGNOSIS — I10 Essential (primary) hypertension: Secondary | ICD-10-CM | POA: Diagnosis not present

## 2019-05-01 DIAGNOSIS — M1990 Unspecified osteoarthritis, unspecified site: Secondary | ICD-10-CM | POA: Insufficient documentation

## 2019-05-01 NOTE — Assessment & Plan Note (Signed)
blood pressure control important in reducing the progression of atherosclerotic disease. On appropriate oral medications.  

## 2019-05-01 NOTE — Assessment & Plan Note (Signed)
At this point, her leg with superficial reflux is the less symptomatic leg and I would not recommend any intervention.  For now, would continue compression stockings, exercise, and elevating her legs.  I can make her a 1 year follow-up or we will see her sooner if problems develop in the interim.

## 2019-05-01 NOTE — Assessment & Plan Note (Signed)
Improved and controlled with compression stockings at this point

## 2019-05-01 NOTE — Progress Notes (Signed)
MRN : [id number]  Katherine Gallegos is a 79 y.o. (February 01, 1941) female who presents with chief complaint of  Chief Complaint  Patient presents with  . Follow-up    6 mo no studies  .  History of Present Illness: Patient returns today in follow up of her leg swelling and venous disease.  She is having sciatica type pain from her left hip down.  Her left leg still has some mild swelling which is chronic and unchanged.  Her right leg is really not swelling much at all at this point.  The compression stockings have helped her on both legs.  No new ulceration or infection.  Current Outpatient Medications  Medication Sig Dispense Refill  . acetaminophen (TYLENOL) 325 MG tablet Take 650 mg by mouth every 6 (six) hours as needed.    Marland Kitchen amLODipine (NORVASC) 5 MG tablet Take 7.5 mg by mouth daily.    . metoprolol tartrate (LOPRESSOR) 25 MG tablet Take 25 mg by mouth 2 (two) times daily.    Marland Kitchen conjugated estrogens (PREMARIN) vaginal cream Place 1 Applicatorful vaginally daily. Apply 0.5mg  (pea-sized amount)  just inside the vaginal introitus with a finger-tip every night for two weeks and then Monday, Wednesday and Friday nights. (Patient not taking: Reported on 08/21/2017) 30 g 12   No current facility-administered medications for this visit.    Past Medical History:  Diagnosis Date  . Allergic state   . Arthritis   . FH: colon polyps   . Hypertension   . Skin cancer     Past Surgical History:  Procedure Laterality Date  . ABDOMINAL HYSTERECTOMY  1977   Partial  . APPENDECTOMY  1977  . BREAST BIOPSY Bilateral 20 + yrs    excisional bx. negative results  . BREAST BIOPSY Left 04/02/2016   benign-FIBROCYSTIC CHANGE  . COLON SURGERY  2014   Large Polypectomy with Bowel Resection (Dr. Rochel Brome)  . COLONOSCOPY WITH ESOPHAGOGASTRODUODENOSCOPY (EGD)    . COLONOSCOPY WITH PROPOFOL N/A 06/13/2016   Procedure: COLONOSCOPY WITH PROPOFOL; adenomatous polyps Surgeon: Manya Silvas, MD;   Location: [facility] ENDOSCOPY;  Service: Endoscopy;  Laterality: N/A;  . ESOPHAGOGASTRODUODENOSCOPY (EGD) WITH PROPOFOL N/A 08/21/2017   Procedure: ESOPHAGOGASTRODUODENOSCOPY (EGD) WITH PROPOFOL;  Surgeon: Manya Silvas, MD;  Location: [facility] ENDOSCOPY;  Service: Endoscopy;  Laterality: N/A;  . VEIN SURGERY  2010     Social History   Tobacco Use  . Smoking status: Never Smoker  . Smokeless tobacco: Never Used  Substance Use Topics  . Alcohol use: No  . Drug use: No    Family History  Problem Relation Age of Onset  . Cancer Sister        bone ca.   . Breast cancer Sister   . Hematuria Neg Hx   . Kidney cancer Neg Hx   . Kidney disease Neg Hx   . Prostate cancer Neg Hx   . Sickle cell trait Neg Hx   . Tuberculosis Neg Hx     Allergies  Allergen Reactions  . Hydrochlorothiazide Rash  . Latex Rash  . Lovastatin Other (See Comments)    Muscle pain     REVIEW OF SYSTEMS (Negative unless checked)  Constitutional: [] Weight loss  [] Fever  [] Chills Cardiac: [] Chest pain   [] Chest pressure   [] Palpitations   [] Shortness of breath when laying flat   [] Shortness of breath at rest   [] Shortness of breath with exertion. Vascular:  [] Pain in legs with walking   [] Pain  in legs at rest   [] Pain in legs when laying flat   [] Claudication   [] Pain in feet when walking  [] Pain in feet at rest  [] Pain in feet when laying flat   [] History of DVT   [] Phlebitis   [x] Swelling in legs   [x] Varicose veins   [] Non-healing ulcers Pulmonary:   [] Uses home oxygen   [] Productive cough   [] Hemoptysis   [] Wheeze  [] COPD   [] Asthma Neurologic:  [] Dizziness  [] Blackouts   [] Seizures   [] History of stroke   [] History of TIA  [] Aphasia   [] Temporary blindness   [] Dysphagia   [] Weakness or numbness in arms   [] Weakness or numbness in legs Musculoskeletal:  [x] Arthritis   [] Joint swelling   [] Joint pain   [] Low back pain Hematologic:  [] Easy bruising  [] Easy bleeding   [] Hypercoagulable state   [] Anemic     Gastrointestinal:  [] Blood in stool   [] Vomiting blood  [x] Gastroesophageal reflux/heartburn   [] Abdominal pain Genitourinary:  [] Chronic kidney disease   [] Difficult urination  [] Frequent urination  [] Burning with urination   [] Hematuria Skin:  [] Rashes   [] Ulcers   [] Wounds Psychological:  [] History of anxiety   []  History of major depression.  Physical Examination  BP (!) 153/78 (BP Location: Right Arm)   Pulse 71   Resp 16   Ht 5\' 3"  (1.6 m)   Wt 152 lb (68.9 kg)   BMI 26.93 kg/m  Gen:  WD/WN, NAD.  Appears younger than stated age Head: [place]/AT, No temporalis wasting. Ear/Nose/Throat: Hearing grossly intact, nares w/o erythema or drainage Eyes: Conjunctiva clear. Sclera non-icteric Neck: Supple.  Trachea midline Pulmonary:  Good air movement, no use of accessory muscles.  Cardiac: RRR, no JVD Vascular:  Vessel Right Left  Radial Palpable Palpable                   Musculoskeletal: M/S 5/5 throughout.  No deformity or atrophy.  1+ left lower extremity edema, trace right lower extremity edema. Neurologic: Sensation grossly intact in extremities.  Symmetrical.  Speech is fluent.  Psychiatric: Judgment intact, Mood & affect appropriate for pt's clinical situation. Dermatologic: No rashes or ulcers noted.  No cellulitis or open wounds.       Labs No results found for this or any previous visit (from the past 2160 hour(s)).  Radiology No results found.  Assessment/Plan  Essential hypertension blood pressure control important in reducing the progression of atherosclerotic disease. On appropriate oral medications.   Swelling of limb Improved and controlled with compression stockings at this point  Varicose veins of leg with pain, bilateral At this point, her leg with superficial reflux is the less symptomatic leg and I would not recommend any intervention.  For now, would continue compression stockings, exercise, and elevating her legs.  I can make her a 1 year  follow-up or we will see her sooner if problems develop in the interim.    Leotis Pain, MD  05/01/2019 10:26 AM    This note was created with Dragon medical transcription system.  Any errors from dictation are purely unintentional

## 2019-05-04 DIAGNOSIS — H40033 Anatomical narrow angle, bilateral: Secondary | ICD-10-CM | POA: Diagnosis not present

## 2019-05-11 DIAGNOSIS — M25552 Pain in left hip: Secondary | ICD-10-CM | POA: Diagnosis not present

## 2019-05-26 DIAGNOSIS — M5416 Radiculopathy, lumbar region: Secondary | ICD-10-CM | POA: Diagnosis not present

## 2019-05-26 DIAGNOSIS — M47816 Spondylosis without myelopathy or radiculopathy, lumbar region: Secondary | ICD-10-CM | POA: Diagnosis not present

## 2019-05-26 DIAGNOSIS — M5136 Other intervertebral disc degeneration, lumbar region: Secondary | ICD-10-CM | POA: Diagnosis not present

## 2019-06-04 DIAGNOSIS — M6281 Muscle weakness (generalized): Secondary | ICD-10-CM | POA: Diagnosis not present

## 2019-06-04 DIAGNOSIS — M5136 Other intervertebral disc degeneration, lumbar region: Secondary | ICD-10-CM | POA: Diagnosis not present

## 2019-06-08 DIAGNOSIS — M5136 Other intervertebral disc degeneration, lumbar region: Secondary | ICD-10-CM | POA: Diagnosis not present

## 2019-06-18 DIAGNOSIS — M5136 Other intervertebral disc degeneration, lumbar region: Secondary | ICD-10-CM | POA: Diagnosis not present

## 2019-08-14 DIAGNOSIS — F4323 Adjustment disorder with mixed anxiety and depressed mood: Secondary | ICD-10-CM | POA: Diagnosis not present

## 2019-08-14 DIAGNOSIS — I872 Venous insufficiency (chronic) (peripheral): Secondary | ICD-10-CM | POA: Diagnosis not present

## 2019-10-01 DIAGNOSIS — I10 Essential (primary) hypertension: Secondary | ICD-10-CM | POA: Diagnosis not present

## 2019-10-01 DIAGNOSIS — E785 Hyperlipidemia, unspecified: Secondary | ICD-10-CM | POA: Diagnosis not present

## 2019-10-02 DIAGNOSIS — R8290 Unspecified abnormal findings in urine: Secondary | ICD-10-CM | POA: Diagnosis not present

## 2019-10-06 DIAGNOSIS — F4321 Adjustment disorder with depressed mood: Secondary | ICD-10-CM | POA: Diagnosis not present

## 2019-10-06 DIAGNOSIS — N8110 Cystocele, unspecified: Secondary | ICD-10-CM | POA: Diagnosis not present

## 2019-10-06 DIAGNOSIS — Z0000 Encounter for general adult medical examination without abnormal findings: Secondary | ICD-10-CM | POA: Diagnosis not present

## 2019-10-06 DIAGNOSIS — I10 Essential (primary) hypertension: Secondary | ICD-10-CM | POA: Diagnosis not present

## 2019-10-06 DIAGNOSIS — E785 Hyperlipidemia, unspecified: Secondary | ICD-10-CM | POA: Diagnosis not present

## 2019-10-14 ENCOUNTER — Other Ambulatory Visit: Payer: Self-pay | Admitting: Family Medicine

## 2019-10-14 DIAGNOSIS — Z1231 Encounter for screening mammogram for malignant neoplasm of breast: Secondary | ICD-10-CM

## 2019-10-23 DIAGNOSIS — N8111 Cystocele, midline: Secondary | ICD-10-CM | POA: Diagnosis not present

## 2019-10-26 DIAGNOSIS — Z4689 Encounter for fitting and adjustment of other specified devices: Secondary | ICD-10-CM | POA: Diagnosis not present

## 2019-10-26 DIAGNOSIS — N8110 Cystocele, unspecified: Secondary | ICD-10-CM | POA: Diagnosis not present

## 2019-11-09 DIAGNOSIS — Z4689 Encounter for fitting and adjustment of other specified devices: Secondary | ICD-10-CM | POA: Diagnosis not present

## 2019-12-17 ENCOUNTER — Other Ambulatory Visit: Payer: Self-pay

## 2019-12-17 ENCOUNTER — Ambulatory Visit
Admission: RE | Admit: 2019-12-17 | Discharge: 2019-12-17 | Disposition: A | Discharge status: Home / Self Care | Payer: PPO | Source: Ambulatory Visit | Attending: Family Medicine | Admitting: Family Medicine

## 2019-12-17 DIAGNOSIS — Z4689 Encounter for fitting and adjustment of other specified devices: Secondary | ICD-10-CM | POA: Diagnosis not present

## 2019-12-17 DIAGNOSIS — Z1231 Encounter for screening mammogram for malignant neoplasm of breast: Secondary | ICD-10-CM | POA: Insufficient documentation

## 2020-01-21 DIAGNOSIS — H2513 Age-related nuclear cataract, bilateral: Secondary | ICD-10-CM | POA: Diagnosis not present

## 2020-01-21 DIAGNOSIS — H40033 Anatomical narrow angle, bilateral: Secondary | ICD-10-CM | POA: Diagnosis not present

## 2020-01-25 DIAGNOSIS — H401131 Primary open-angle glaucoma, bilateral, mild stage: Secondary | ICD-10-CM | POA: Diagnosis not present

## 2020-01-25 DIAGNOSIS — H2512 Age-related nuclear cataract, left eye: Secondary | ICD-10-CM | POA: Diagnosis not present

## 2020-01-25 DIAGNOSIS — Z01818 Encounter for other preprocedural examination: Secondary | ICD-10-CM | POA: Diagnosis not present

## 2020-01-25 DIAGNOSIS — H2511 Age-related nuclear cataract, right eye: Secondary | ICD-10-CM | POA: Diagnosis not present

## 2020-02-09 DIAGNOSIS — N8110 Cystocele, unspecified: Secondary | ICD-10-CM | POA: Diagnosis not present

## 2020-02-09 DIAGNOSIS — Z4689 Encounter for fitting and adjustment of other specified devices: Secondary | ICD-10-CM | POA: Diagnosis not present

## 2020-02-11 DIAGNOSIS — H2512 Age-related nuclear cataract, left eye: Secondary | ICD-10-CM | POA: Diagnosis not present

## 2020-02-11 DIAGNOSIS — H409 Unspecified glaucoma: Secondary | ICD-10-CM | POA: Diagnosis not present

## 2020-02-11 DIAGNOSIS — H401131 Primary open-angle glaucoma, bilateral, mild stage: Secondary | ICD-10-CM | POA: Diagnosis not present

## 2020-02-11 DIAGNOSIS — H401121 Primary open-angle glaucoma, left eye, mild stage: Secondary | ICD-10-CM | POA: Diagnosis not present

## 2020-02-25 DIAGNOSIS — H2511 Age-related nuclear cataract, right eye: Secondary | ICD-10-CM | POA: Diagnosis not present

## 2020-02-25 DIAGNOSIS — H409 Unspecified glaucoma: Secondary | ICD-10-CM | POA: Diagnosis not present

## 2020-02-25 DIAGNOSIS — H401111 Primary open-angle glaucoma, right eye, mild stage: Secondary | ICD-10-CM | POA: Diagnosis not present

## 2020-02-25 DIAGNOSIS — H401131 Primary open-angle glaucoma, bilateral, mild stage: Secondary | ICD-10-CM | POA: Diagnosis not present

## 2020-04-05 DIAGNOSIS — R300 Dysuria: Secondary | ICD-10-CM | POA: Diagnosis not present

## 2020-04-07 DIAGNOSIS — E785 Hyperlipidemia, unspecified: Secondary | ICD-10-CM | POA: Diagnosis not present

## 2020-04-07 DIAGNOSIS — M545 Low back pain, unspecified: Secondary | ICD-10-CM | POA: Diagnosis not present

## 2020-04-07 DIAGNOSIS — M1990 Unspecified osteoarthritis, unspecified site: Secondary | ICD-10-CM | POA: Diagnosis not present

## 2020-04-07 DIAGNOSIS — F4323 Adjustment disorder with mixed anxiety and depressed mood: Secondary | ICD-10-CM | POA: Diagnosis not present

## 2020-04-07 DIAGNOSIS — I10 Essential (primary) hypertension: Secondary | ICD-10-CM | POA: Diagnosis not present

## 2020-04-29 ENCOUNTER — Other Ambulatory Visit: Payer: Self-pay

## 2020-04-29 ENCOUNTER — Encounter (INDEPENDENT_AMBULATORY_CARE_PROVIDER_SITE_OTHER): Payer: Self-pay | Admitting: Vascular Surgery

## 2020-04-29 ENCOUNTER — Ambulatory Visit (INDEPENDENT_AMBULATORY_CARE_PROVIDER_SITE_OTHER): Payer: PPO | Admitting: Vascular Surgery

## 2020-04-29 VITALS — BP 118/70 | HR 67 | Resp 16 | Wt 156.0 lb

## 2020-04-29 DIAGNOSIS — I10 Essential (primary) hypertension: Secondary | ICD-10-CM | POA: Diagnosis not present

## 2020-04-29 DIAGNOSIS — R600 Localized edema: Secondary | ICD-10-CM

## 2020-04-29 DIAGNOSIS — M7989 Other specified soft tissue disorders: Secondary | ICD-10-CM

## 2020-04-29 DIAGNOSIS — I83813 Varicose veins of bilateral lower extremities with pain: Secondary | ICD-10-CM | POA: Diagnosis not present

## 2020-04-29 NOTE — Assessment & Plan Note (Signed)
Mild.  A compression stockings are controlling reasonably well.

## 2020-04-29 NOTE — Progress Notes (Signed)
MRN : [id number]  Katherine Gallegos is a 80 y.o. ([birth year]) female who presents with chief complaint of  Chief Complaint  Patient presents with  . Follow-up    1 yr follow up  .  History of Present Illness: Patient returns today in follow up of her venous insufficiency.  She does still have some swelling in the left leg comparable to the right leg.  This is the leg with reflux.  This has not been bothersome enough to her to warrant any intervention in the past, and her symptoms really have not changed from what they were at her last visit a year ago.  She continues to wear her compression stockings regularly and elevate her legs.  No ulceration.  No fevers or chills.  Current Outpatient Medications  Medication Sig Dispense Refill  . acetaminophen (TYLENOL) 325 MG tablet Take 650 mg by mouth every 6 (six) hours as needed.    Marland Kitchen amLODipine (NORVASC) 5 MG tablet Take 7.5 mg by mouth daily.    . metoprolol tartrate (LOPRESSOR) 25 MG tablet Take 25 mg by mouth 2 (two) times daily.    Marland Kitchen conjugated estrogens (PREMARIN) vaginal cream Place 1 Applicatorful vaginally daily. Apply 0.5mg  (pea-sized amount)  just inside the vaginal introitus with a finger-tip every night for two weeks and then Monday, Wednesday and Friday nights. (Patient not taking: No sig reported) 30 g 12   No current facility-administered medications for this visit.    Past Medical History:  Diagnosis Date  . Allergic state   . Arthritis   . FH: colon polyps   . Hypertension   . Skin cancer     Past Surgical History:  Procedure Laterality Date  . ABDOMINAL HYSTERECTOMY  1977   Partial  . APPENDECTOMY  1977  . BREAST BIOPSY Bilateral 20 + yrs    excisional bx. negative results  . BREAST BIOPSY Left 04/02/2016   benign-FIBROCYSTIC CHANGE  . COLON SURGERY  2014   Large Polypectomy with Bowel Resection (Dr. Rochel Brome)  . COLONOSCOPY WITH ESOPHAGOGASTRODUODENOSCOPY (EGD)    . COLONOSCOPY WITH PROPOFOL N/A 06/13/2016    Procedure: COLONOSCOPY WITH PROPOFOL; adenomatous polyps Surgeon: Manya Silvas, MD;  Location: [facility] ENDOSCOPY;  Service: Endoscopy;  Laterality: N/A;  . ESOPHAGOGASTRODUODENOSCOPY (EGD) WITH PROPOFOL N/A 08/21/2017   Procedure: ESOPHAGOGASTRODUODENOSCOPY (EGD) WITH PROPOFOL;  Surgeon: Manya Silvas, MD;  Location: [facility] ENDOSCOPY;  Service: Endoscopy;  Laterality: N/A;  . VEIN SURGERY  2010     Social History   Tobacco Use  . Smoking status: Never Smoker  . Smokeless tobacco: Never Used  Vaping Use  . Vaping Use: Never used  Substance Use Topics  . Alcohol use: No  . Drug use: No     Family History  Problem Relation Age of Onset  . Cancer Sister        bone ca.   . Breast cancer Sister   . Hematuria Neg Hx   . Kidney cancer Neg Hx   . Kidney disease Neg Hx   . Prostate cancer Neg Hx   . Sickle cell trait Neg Hx   . Tuberculosis Neg Hx     Allergies  Allergen Reactions  . Hydrochlorothiazide Rash  . Latex Rash  . Lovastatin Other (See Comments)    Muscle pain    REVIEW OF SYSTEMS (Negative unless checked)  Constitutional: [] ?Weight loss  [] ?Fever  [] ?Chills Cardiac: [] ?Chest pain   [] ?Chest pressure   [] ?Palpitations   [] ?Shortness of  breath when laying flat   [] ?Shortness of breath at rest   [] ?Shortness of breath with exertion. Vascular:  [] ?Pain in legs with walking   [] ?Pain in legs at rest   [] ?Pain in legs when laying flat   [] ?Claudication   [] ?Pain in feet when walking  [] ?Pain in feet at rest  [] ?Pain in feet when laying flat   [] ?History of DVT   [] ?Phlebitis   [x] ?Swelling in legs   [x] ?Varicose veins   [] ?Non-healing ulcers Pulmonary:   [] ?Uses home oxygen   [] ?Productive cough   [] ?Hemoptysis   [] ?Wheeze  [] ?COPD   [] ?Asthma Neurologic:  [] ?Dizziness  [] ?Blackouts   [] ?Seizures   [] ?History of stroke   [] ?History of TIA  [] ?Aphasia   [] ?Temporary blindness   [] ?Dysphagia   [] ?Weakness or numbness in arms   [] ?Weakness or numbness in  legs Musculoskeletal:  [x] ?Arthritis   [] ?Joint swelling   [] ?Joint pain   [] ?Low back pain Hematologic:  [] ?Easy bruising  [] ?Easy bleeding   [] ?Hypercoagulable state   [] ?Anemic   Gastrointestinal:  [] ?Blood in stool   [] ?Vomiting blood  [x] ?Gastroesophageal reflux/heartburn   [] ?Abdominal pain Genitourinary:  [] ?Chronic kidney disease   [] ?Difficult urination  [] ?Frequent urination  [] ?Burning with urination   [] ?Hematuria Skin:  [] ?Rashes   [] ?Ulcers   [] ?Wounds Psychological:  [] ?History of anxiety   [] ? History of major depression.   Physical Examination  BP 118/70 (BP Location: Right Arm)   Pulse 67   Resp 16   Wt 156 lb (70.8 kg)   BMI 27.63 kg/m  Gen:  WD/WN, NAD.  Appears younger than stated age Head: [place]/AT, No temporalis wasting. Ear/Nose/Throat: Hearing grossly intact, nares w/o erythema or drainage Eyes: Conjunctiva clear. Sclera non-icteric Neck: Supple.  Trachea midline Pulmonary:  Good air movement, no use of accessory muscles.  Cardiac: RRR, no JVD Vascular: Diffuse varicosities bilaterally, more so on the left than the right. Vessel Right Left  Radial Palpable Palpable                          PT Palpable Palpable  DP Palpable Palpable   Gastrointestinal: soft, non-tender/non-distended. No guarding/reflex.  Musculoskeletal: M/S 5/5 throughout.  No deformity or atrophy.  Trace left lower extremity edema. Neurologic: Sensation grossly intact in extremities.  Symmetrical.  Speech is fluent.  Psychiatric: Judgment intact, Mood & affect appropriate for pt's clinical situation. Dermatologic: No rashes or ulcers noted.  No cellulitis or open wounds.       Labs No results found for this or any previous visit (from the past 2160 hour(s)).  Radiology No results found.  Assessment/Plan  Essential hypertension blood pressure control important in reducing the progression of atherosclerotic disease. On appropriate oral medications.   Varicose veins of  leg with pain, bilateral Symptoms remain fairly mild and she is not interested in any intervention at this time.  Continue to wear her compression stockings and elevate her legs.  Return to clinic in 1 year.  Swelling of limb Mild.  A compression stockings are controlling reasonably well.    Leotis Pain, MD  04/29/2020 9:29 AM    This note was created with Dragon medical transcription system.  Any errors from dictation are purely unintentional

## 2020-04-29 NOTE — Assessment & Plan Note (Signed)
Symptoms remain fairly mild and she is not interested in any intervention at this time.  Continue to wear her compression stockings and elevate her legs.  Return to clinic in 1 year.

## 2020-04-29 NOTE — Assessment & Plan Note (Signed)
blood pressure control important in reducing the progression of atherosclerotic disease. On appropriate oral medications.  

## 2020-05-30 DIAGNOSIS — K649 Unspecified hemorrhoids: Secondary | ICD-10-CM | POA: Diagnosis not present

## 2020-09-13 DIAGNOSIS — Z4689 Encounter for fitting and adjustment of other specified devices: Secondary | ICD-10-CM | POA: Diagnosis not present

## 2020-09-13 DIAGNOSIS — N8110 Cystocele, unspecified: Secondary | ICD-10-CM | POA: Diagnosis not present

## 2020-10-12 DIAGNOSIS — I10 Essential (primary) hypertension: Secondary | ICD-10-CM | POA: Diagnosis not present

## 2020-10-12 DIAGNOSIS — E785 Hyperlipidemia, unspecified: Secondary | ICD-10-CM | POA: Diagnosis not present

## 2020-10-13 DIAGNOSIS — R8290 Unspecified abnormal findings in urine: Secondary | ICD-10-CM | POA: Diagnosis not present

## 2020-10-19 DIAGNOSIS — Z0000 Encounter for general adult medical examination without abnormal findings: Secondary | ICD-10-CM | POA: Diagnosis not present

## 2020-10-19 DIAGNOSIS — E785 Hyperlipidemia, unspecified: Secondary | ICD-10-CM | POA: Diagnosis not present

## 2020-10-19 DIAGNOSIS — I10 Essential (primary) hypertension: Secondary | ICD-10-CM | POA: Diagnosis not present

## 2020-12-12 ENCOUNTER — Other Ambulatory Visit: Payer: Self-pay | Admitting: Family Medicine

## 2020-12-12 DIAGNOSIS — Z1231 Encounter for screening mammogram for malignant neoplasm of breast: Secondary | ICD-10-CM

## 2020-12-15 DIAGNOSIS — N8110 Cystocele, unspecified: Secondary | ICD-10-CM | POA: Diagnosis not present

## 2020-12-15 DIAGNOSIS — Z4689 Encounter for fitting and adjustment of other specified devices: Secondary | ICD-10-CM | POA: Diagnosis not present

## 2020-12-16 ENCOUNTER — Telehealth (INDEPENDENT_AMBULATORY_CARE_PROVIDER_SITE_OTHER): Payer: Self-pay | Admitting: Vascular Surgery

## 2020-12-16 NOTE — Telephone Encounter (Signed)
Called stating that she is having swelling/pain in her left leg. Patient is wearing compression socks and elevating her leg as much as possible. Patient was last seen 04/2020 (JD) Please advise.

## 2020-12-16 NOTE — Telephone Encounter (Signed)
Patient can be schedule with left le reflux ultrasound see Lucky Cowboy or Arna Medici

## 2020-12-16 NOTE — Telephone Encounter (Signed)
Called and scheduled patient

## 2020-12-19 ENCOUNTER — Ambulatory Visit
Admission: RE | Admit: 2020-12-19 | Discharge: 2020-12-19 | Disposition: A | Discharge status: Home / Self Care | Payer: PPO | Source: Ambulatory Visit | Attending: Family Medicine | Admitting: Family Medicine

## 2020-12-19 ENCOUNTER — Other Ambulatory Visit: Payer: Self-pay

## 2020-12-19 ENCOUNTER — Other Ambulatory Visit (INDEPENDENT_AMBULATORY_CARE_PROVIDER_SITE_OTHER): Payer: Self-pay | Admitting: Nurse Practitioner

## 2020-12-19 ENCOUNTER — Other Ambulatory Visit (INDEPENDENT_AMBULATORY_CARE_PROVIDER_SITE_OTHER): Payer: Self-pay | Admitting: Vascular Surgery

## 2020-12-19 DIAGNOSIS — Z1231 Encounter for screening mammogram for malignant neoplasm of breast: Secondary | ICD-10-CM | POA: Diagnosis not present

## 2020-12-19 DIAGNOSIS — M79662 Pain in left lower leg: Secondary | ICD-10-CM

## 2020-12-20 ENCOUNTER — Encounter (INDEPENDENT_AMBULATORY_CARE_PROVIDER_SITE_OTHER): Payer: Self-pay | Admitting: Vascular Surgery

## 2020-12-20 ENCOUNTER — Ambulatory Visit (INDEPENDENT_AMBULATORY_CARE_PROVIDER_SITE_OTHER): Payer: PPO

## 2020-12-20 ENCOUNTER — Ambulatory Visit (INDEPENDENT_AMBULATORY_CARE_PROVIDER_SITE_OTHER): Payer: PPO | Admitting: Vascular Surgery

## 2020-12-20 ENCOUNTER — Other Ambulatory Visit: Payer: Self-pay

## 2020-12-20 VITALS — BP 133/75 | HR 69 | Resp 16 | Wt 159.6 lb

## 2020-12-20 DIAGNOSIS — M79662 Pain in left lower leg: Secondary | ICD-10-CM

## 2020-12-20 DIAGNOSIS — I83813 Varicose veins of bilateral lower extremities with pain: Secondary | ICD-10-CM | POA: Diagnosis not present

## 2020-12-20 DIAGNOSIS — I10 Essential (primary) hypertension: Secondary | ICD-10-CM

## 2020-12-20 DIAGNOSIS — M7989 Other specified soft tissue disorders: Secondary | ICD-10-CM | POA: Diagnosis not present

## 2020-12-20 NOTE — Assessment & Plan Note (Signed)
Left lower extremity duplex today shows no evidence of DVT or superficial thrombophlebitis.  The left great saphenous vein is absent after previous ablation about a decade ago.  She does have reflux in the left popliteal vein.  This is unchanged from previous.  Some of her increased symptoms recently sound like they are due to sciatica and marked decrease in mobility.  We discussed elevation and continued compression is much as possible.  No role for venous intervention at this point.  Recheck in 1 year.

## 2020-12-20 NOTE — Assessment & Plan Note (Signed)
Persistent and bilateral.  Likely combination of venous insufficiency, lymphedema, and may also be a side effect of her Norvasc.  Her blood pressure control is excellent with the Norvasc in general and her primary care physician is not inclined to remove it and I understand this.  No new DVT or superficial thrombophlebitis.  Continue compression, elevation, and activity as tolerated.

## 2020-12-20 NOTE — Progress Notes (Signed)
MRN : [id number]  Katherine Gallegos is a 80 y.o. (09-19-1940) female who presents with chief complaint of  Chief Complaint  Patient presents with   Follow-up    Add on for lle pain/swelling  .  History of Present Illness: Patient returns today in follow up earlier than her scheduled visit for LLE pain and swelling increases.  She has been troubled by sciatica in the left leg now for a couple of months.  This is resulted in significant decrease in mobility.  She has had more swelling in the left leg.  Her pain has been significant.  She denies any ulceration or infection.  She still has prominent varicosities which have been present for some time.  Left lower extremity duplex today shows no evidence of DVT or superficial thrombophlebitis with an absent left great saphenous vein after previous ablation.  She does have deep venous reflux present which has been present previously.  Current Outpatient Medications  Medication Sig Dispense Refill   acetaminophen (TYLENOL) 325 MG tablet Take 650 mg by mouth every 6 (six) hours as needed.     amLODipine (NORVASC) 5 MG tablet Take 7.5 mg by mouth daily.     metoprolol tartrate (LOPRESSOR) 25 MG tablet Take 25 mg by mouth 2 (two) times daily.     conjugated estrogens (PREMARIN) vaginal cream Place 1 Applicatorful vaginally daily. Apply 0.5mg  (pea-sized amount)  just inside the vaginal introitus with a finger-tip every night for two weeks and then Monday, Wednesday and Friday nights. (Patient not taking: No sig reported) 30 g 12   No current facility-administered medications for this visit.    Past Medical History:  Diagnosis Date   Allergic state    Arthritis    FH: colon polyps    Hypertension    Skin cancer     Past Surgical History:  Procedure Laterality Date   ABDOMINAL HYSTERECTOMY  1977   Partial   APPENDECTOMY  1977   BREAST BIOPSY Bilateral 20 + yrs    excisional bx. negative results   BREAST BIOPSY Left 04/02/2016    benign-FIBROCYSTIC CHANGE   COLON SURGERY  2014   Large Polypectomy with Bowel Resection (Dr. Rochel Brome)   COLONOSCOPY WITH ESOPHAGOGASTRODUODENOSCOPY (EGD)     COLONOSCOPY WITH PROPOFOL N/A 06/13/2016   Procedure: COLONOSCOPY WITH PROPOFOL; adenomatous polyps Surgeon: Manya Silvas, MD;  Location: [facility];  Service: Endoscopy;  Laterality: N/A;   ESOPHAGOGASTRODUODENOSCOPY (EGD) WITH PROPOFOL N/A 08/21/2017   Procedure: ESOPHAGOGASTRODUODENOSCOPY (EGD) WITH PROPOFOL;  Surgeon: Manya Silvas, MD;  Location: [facility] ENDOSCOPY;  Service: Endoscopy;  Laterality: N/A;   VEIN SURGERY  2010     Social History   Tobacco Use   Smoking status: Never   Smokeless tobacco: Never  Vaping Use   Vaping Use: Never used  Substance Use Topics   Alcohol use: No   Drug use: No       Family History  Problem Relation Age of Onset   Cancer Sister        bone ca.    Breast cancer Sister    Hematuria Neg Hx    Kidney cancer Neg Hx    Kidney disease Neg Hx    Prostate cancer Neg Hx    Sickle cell trait Neg Hx    Tuberculosis Neg Hx      Allergies  Allergen Reactions   Hydrochlorothiazide Rash   Latex Rash   Lovastatin Other (See Comments)    Muscle pain  REVIEW OF SYSTEMS (Negative unless checked)   Constitutional: [] Weight loss  [] Fever  [] Chills Cardiac: [] Chest pain   [] Chest pressure   [] Palpitations   [] Shortness of breath when laying flat   [] Shortness of breath at rest   [] Shortness of breath with exertion. Vascular:  [] Pain in legs with walking   [] Pain in legs at rest   [] Pain in legs when laying flat   [] Claudication   [] Pain in feet when walking  [] Pain in feet at rest  [] Pain in feet when laying flat   [] History of DVT   [] Phlebitis   [x] Swelling in legs   [x] Varicose veins   [] Non-healing ulcers Pulmonary:   [] Uses home oxygen   [] Productive cough   [] Hemoptysis   [] Wheeze  [] COPD   [] Asthma Neurologic:  [] Dizziness  [] Blackouts   [] Seizures   [] History of  stroke   [] History of TIA  [] Aphasia   [] Temporary blindness   [] Dysphagia   [] Weakness or numbness in arms   [] Weakness or numbness in legs Musculoskeletal:  [x] Arthritis   [] Joint swelling   [] Joint pain   [] Low back pain Hematologic:  [] Easy bruising  [] Easy bleeding   [] Hypercoagulable state   [] Anemic   Gastrointestinal:  [] Blood in stool   [] Vomiting blood  [x] Gastroesophageal reflux/heartburn   [] Abdominal pain Genitourinary:  [] Chronic kidney disease   [] Difficult urination  [] Frequent urination  [] Burning with urination   [] Hematuria Skin:  [] Rashes   [] Ulcers   [] Wounds Psychological:  [] History of anxiety   []  History of major depression.  Physical Examination  BP 133/75 (BP Location: Right Arm)   Pulse 69   Resp 16   Wt 159 lb 9.6 oz (72.4 kg)   BMI 28.27 kg/m  Gen:  WD/WN, NAD Head: [place]/AT, No temporalis wasting. Ear/Nose/Throat: Hearing grossly intact, nares w/o erythema or drainage Eyes: Conjunctiva clear. Sclera non-icteric Neck: Supple.  Trachea midline Pulmonary:  Good air movement, no use of accessory muscles.  Cardiac: RRR, no JVD Vascular:  Vessel Right Left  Radial Palpable Palpable                          PT Palpable Palpable  DP Palpable Palpable    Musculoskeletal: M/S 5/5 throughout.  No deformity or atrophy. Mild BLE edema. Neurologic: Sensation grossly intact in extremities.  Symmetrical.  Speech is fluent.  Psychiatric: Judgment intact, Mood & affect appropriate for pt's clinical situation. Dermatologic: No rashes or ulcers noted.  No cellulitis or open wounds.      Labs No results found for this or any previous visit (from the past 2160 hour(s)).  Radiology No results found.  Assessment/Plan Essential hypertension blood pressure control important in reducing the progression of atherosclerotic disease. On appropriate oral medications.  Swelling of limb Persistent and bilateral.  Likely combination of venous insufficiency,  lymphedema, and may also be a side effect of her Norvasc.  Her blood pressure control is excellent with the Norvasc in general and her primary care physician is not inclined to remove it and I understand this.  No new DVT or superficial thrombophlebitis.  Continue compression, elevation, and activity as tolerated.  Varicose veins of leg with pain, bilateral Left lower extremity duplex today shows no evidence of DVT or superficial thrombophlebitis.  The left great saphenous vein is absent after previous ablation about a decade ago.  She does have reflux in the left popliteal vein.  This is unchanged from previous.  Some of her increased  symptoms recently sound like they are due to sciatica and marked decrease in mobility.  We discussed elevation and continued compression is much as possible.  No role for venous intervention at this point.  Recheck in 1 year.    Leotis Pain, MD  12/20/2020 2:32 PM    This note was created with Dragon medical transcription system.  Any errors from dictation are purely unintentional

## 2021-03-08 DIAGNOSIS — Z4689 Encounter for fitting and adjustment of other specified devices: Secondary | ICD-10-CM | POA: Diagnosis not present

## 2021-03-08 DIAGNOSIS — N8110 Cystocele, unspecified: Secondary | ICD-10-CM | POA: Diagnosis not present

## 2021-04-20 DIAGNOSIS — I10 Essential (primary) hypertension: Secondary | ICD-10-CM | POA: Diagnosis not present

## 2021-04-20 DIAGNOSIS — E785 Hyperlipidemia, unspecified: Secondary | ICD-10-CM | POA: Diagnosis not present

## 2021-04-28 ENCOUNTER — Ambulatory Visit (INDEPENDENT_AMBULATORY_CARE_PROVIDER_SITE_OTHER): Payer: PPO | Admitting: Vascular Surgery

## 2021-06-06 DIAGNOSIS — N8110 Cystocele, unspecified: Secondary | ICD-10-CM | POA: Diagnosis not present

## 2021-06-06 DIAGNOSIS — Z4689 Encounter for fitting and adjustment of other specified devices: Secondary | ICD-10-CM | POA: Diagnosis not present

## 2021-07-03 ENCOUNTER — Other Ambulatory Visit: Payer: Self-pay | Admitting: Family Medicine

## 2021-07-04 ENCOUNTER — Ambulatory Visit (INDEPENDENT_AMBULATORY_CARE_PROVIDER_SITE_OTHER): Payer: PPO | Admitting: Vascular Surgery

## 2021-08-15 ENCOUNTER — Ambulatory Visit (INDEPENDENT_AMBULATORY_CARE_PROVIDER_SITE_OTHER): Payer: PPO | Admitting: Vascular Surgery

## 2021-08-17 DIAGNOSIS — R32 Unspecified urinary incontinence: Secondary | ICD-10-CM | POA: Diagnosis not present

## 2021-09-20 ENCOUNTER — Other Ambulatory Visit: Payer: Self-pay | Admitting: Family Medicine

## 2021-09-20 DIAGNOSIS — Z1231 Encounter for screening mammogram for malignant neoplasm of breast: Secondary | ICD-10-CM

## 2021-10-03 ENCOUNTER — Ambulatory Visit: Payer: PPO | Admitting: Dermatology

## 2021-10-03 DIAGNOSIS — L821 Other seborrheic keratosis: Secondary | ICD-10-CM | POA: Diagnosis not present

## 2021-10-03 DIAGNOSIS — L820 Inflamed seborrheic keratosis: Secondary | ICD-10-CM

## 2021-10-03 DIAGNOSIS — D1801 Hemangioma of skin and subcutaneous tissue: Secondary | ICD-10-CM | POA: Diagnosis not present

## 2021-10-03 DIAGNOSIS — L578 Other skin changes due to chronic exposure to nonionizing radiation: Secondary | ICD-10-CM | POA: Diagnosis not present

## 2021-10-03 DIAGNOSIS — Z85828 Personal history of other malignant neoplasm of skin: Secondary | ICD-10-CM | POA: Diagnosis not present

## 2021-10-03 DIAGNOSIS — L814 Other melanin hyperpigmentation: Secondary | ICD-10-CM | POA: Diagnosis not present

## 2021-10-03 DIAGNOSIS — D1800 Hemangioma unspecified site: Secondary | ICD-10-CM

## 2021-10-03 DIAGNOSIS — D2239 Melanocytic nevi of other parts of face: Secondary | ICD-10-CM | POA: Diagnosis not present

## 2021-10-03 DIAGNOSIS — D229 Melanocytic nevi, unspecified: Secondary | ICD-10-CM

## 2021-10-03 DIAGNOSIS — Z1283 Encounter for screening for malignant neoplasm of skin: Secondary | ICD-10-CM | POA: Diagnosis not present

## 2021-10-03 DIAGNOSIS — D489 Neoplasm of uncertain behavior, unspecified: Secondary | ICD-10-CM

## 2021-10-03 DIAGNOSIS — D485 Neoplasm of uncertain behavior of skin: Secondary | ICD-10-CM

## 2021-10-03 NOTE — Progress Notes (Signed)
Follow-Up Visit   Subjective  Katherine Gallegos is a 81 y.o. female who presents for the following: New Patient (Initial Visit) (Patient here today for tbse. Has a spot at right neck, some spots at arm, spots at back she would like checked. Reports hx of bcc many years ago at left upper back area.). Some are irritating and bothersome, and she would like them removed.  The patient presents for Total-Body Skin Exam (TBSE) for skin cancer screening and mole check.  The patient has spots, moles and lesions to be evaluated, some may be new or changing and the patient has concerns that these could be cancer.   The following portions of the chart were reviewed this encounter and updated as appropriate:      Review of Systems: No other skin or systemic complaints except as noted in HPI or Assessment and Plan.   Objective  Well appearing patient in no apparent distress; mood and affect are within normal limits.  A full examination was performed including scalp, head, eyes, ears, nose, lips, neck, chest, axillae, abdomen, back, buttocks, bilateral upper extremities, bilateral lower extremities, hands, feet, fingers, toes, fingernails, and toenails. All findings within normal limits unless otherwise noted below.  right posterior neck 6 mm flesh papule   left mid back 5 mm red pedunculated papule   left wrist x 1, right forearm x 1 (2) Erythematous stuck-on, waxy papule  nasal dorsum 1 mm firm flesh papules x 2   Assessment & Plan  Neoplasm of uncertain behavior (2) right posterior neck  Epidermal / dermal shaving  Lesion diameter (cm):  0.6 Informed consent: discussed and consent obtained   Patient was prepped and draped in usual sterile fashion: Area prepped with alcohol. Anesthesia: the lesion was anesthetized in a standard fashion   Anesthetic:  1% lidocaine w/ epinephrine 1-100,000 buffered w/ 8.4% NaHCO3 Instrument used: flexible razor blade   Hemostasis achieved with:  pressure, aluminum chloride and electrodesiccation   Outcome: patient tolerated procedure well   Post-procedure details: wound care instructions given   Post-procedure details comment:  Ointment and small bandage applied.   Specimen 1 - Surgical pathology Differential Diagnosis: Irritated nevus vs neurofibroma   Check Margins: No  left mid back  Epidermal / dermal shaving  Lesion diameter (cm):  0.5 Informed consent: discussed and consent obtained   Patient was prepped and draped in usual sterile fashion: Area prepped with alcohol. Anesthesia: the lesion was anesthetized in a standard fashion   Anesthetic:  1% lidocaine w/ epinephrine 1-100,000 buffered w/ 8.4% NaHCO3 Instrument used: flexible razor blade   Hemostasis achieved with: pressure, aluminum chloride and electrodesiccation   Outcome: patient tolerated procedure well   Post-procedure details: wound care instructions given   Post-procedure details comment:  Ointment and small bandage applied.   Specimen 2 - Surgical pathology Differential Diagnosis: R/o hemangioma vs other   Check Margins: No  Symptomatic, irritating, patient would like removed.  Irritated nevus vs neurofibroma- neck   R/o hemangioma vs skin tag- back   Inflamed seborrheic keratosis (2) left wrist x 1, right forearm x 1  Symptomatic, irritating, patient would like treated.  Destruction of lesion - left wrist x 1, right forearm x 1  Destruction method: cryotherapy   Informed consent: discussed and consent obtained   Lesion destroyed using liquid nitrogen: Yes   Region frozen until ice ball extended beyond lesion: Yes   Outcome: patient tolerated procedure well with no complications   Post-procedure details: wound care  instructions given   Additional details:  Prior to procedure, discussed risks of blister formation, small wound, skin dyspigmentation, or rare scar following cryotherapy. Recommend Vaseline ointment to treated areas while  healing.   Fibrous papule of nose nasal dorsum  Benign-appearing.  Observation.      Lentigines - Scattered tan macules - Due to sun exposure - Benign-appearing, observe - Recommend daily broad spectrum sunscreen SPF 30+ to sun-exposed areas, reapply every 2 hours as needed. - Call for any changes  Seborrheic Keratoses - Stuck-on, waxy, tan-brown papules and/or plaques at upper arms , back - Benign-appearing - Discussed benign etiology and prognosis. - Observe - Call for any changes  Melanocytic Nevi - Tan-brown and/or pink-flesh-colored symmetric macules and papules - Benign appearing on exam today - Observation - Call clinic for new or changing moles - Recommend daily use of broad spectrum spf 30+ sunscreen to sun-exposed areas.   Hemangiomas - Red papules at back , behind ears  - Discussed benign nature - Observe - Call for any changes  Actinic Damage - Chronic condition, secondary to cumulative UV/sun exposure - diffuse scaly erythematous macules with underlying dyspigmentation - Recommend daily broad spectrum sunscreen SPF 30+ to sun-exposed areas, reapply every 2 hours as needed.  - Staying in the shade or wearing long sleeves, sun glasses (UVA+UVB protection) and wide brim hats (4-inch brim around the entire circumference of the hat) are also recommended for sun protection.  - Call for new or changing lesions.  History of Basal Cell Carcinoma of the Skin Patient reports history of bcc at left upper back area  - No evidence of recurrence today - Recommend regular full body skin exams - Recommend daily broad spectrum sunscreen SPF 30+ to sun-exposed areas, reapply every 2 hours as needed.  - Call if any new or changing lesions are noted between office visits  Skin cancer screening performed today. Return in about 1 year (around 10/04/2022) for TBSE. I, Ruthell Rummage, CMA, am acting as scribe for Brendolyn Patty, MD.  Documentation: I have reviewed the above  documentation for accuracy and completeness, and I agree with the above.  Brendolyn Patty MD

## 2021-10-03 NOTE — Patient Instructions (Addendum)
  Biopsy Wound Care Instructions  Leave the original bandage on for 24 hours if possible.  If the bandage becomes soaked or soiled before that time, it is OK to remove it and examine the wound.  A small amount of post-operative bleeding is normal.  If excessive bleeding occurs, remove the bandage, place gauze over the site and apply continuous pressure (no peeking) over the area for 30 minutes. If this does not work, please call our clinic as soon as possible or page your doctor if it is after hours.   Once a day, cleanse the wound with soap and water. It is fine to shower. If a thick crust develops you may use a Q-tip dipped into dilute hydrogen peroxide (mix 1:1 with water) to dissolve it.  Hydrogen peroxide can slow the healing process, so use it only as needed.    After washing, apply petroleum jelly (Vaseline) or an antibiotic ointment if your doctor prescribed one for you, followed by a bandage.    For best healing, the wound should be covered with a layer of ointment at all times. If you are not able to keep the area covered with a bandage to hold the ointment in place, this may mean re-applying the ointment several times a day.  Continue this wound care until the wound has healed and is no longer open.   Itching and mild discomfort is normal during the healing process. However, if you develop pain or severe itching, please call our office.   If you have any discomfort, you can take Tylenol (acetaminophen) or ibuprofen as directed on the bottle. (Please do not take these if you have an allergy to them or cannot take them for another reason).  Some redness, tenderness and white or yellow material in the wound is normal healing.  If the area becomes very sore and red, or develops a thick yellow-green material (pus), it may be infected; please notify us.    If you have stitches, return to clinic as directed to have the stitches removed. You will continue wound care for 2-3 days after the  stitches are removed.   Wound healing continues for up to one year following surgery. It is not unusual to experience pain in the scar from time to time during the interval.  If the pain becomes severe or the scar thickens, you should notify the office.    A slight amount of redness in a scar is expected for the first six months.  After six months, the redness will fade and the scar will soften and fade.  The color difference becomes less noticeable with time.  If there are any problems, return for a post-op surgery check at your earliest convenience.  To improve the appearance of the scar, you can use silicone scar gel, cream, or sheets (such as Mederma or Serica) every night for up to one year. These are available over the counter (without a prescription).  Please call our office at [phone] for any questions or concerns.      Seborrheic Keratosis  What causes seborrheic keratoses? Seborrheic keratoses are harmless, common skin growths that first appear during adult life.  As time goes by, more growths appear.  Some people may develop a large number of them.  Seborrheic keratoses appear on both covered and uncovered body parts.  They are not caused by sunlight.  The tendency to develop seborrheic keratoses can be inherited.  They vary in color from skin-colored to gray, brown, or even   black.  They can be either smooth or have a rough, warty surface.   Seborrheic keratoses are superficial and look as if they were stuck on the skin.  Under the microscope this type of keratosis looks like layers upon layers of skin.  That is why at times the top layer may seem to fall off, but the rest of the growth remains and re-grows.    Treatment Seborrheic keratoses do not need to be treated, but can easily be removed in the office.  Seborrheic keratoses often cause symptoms when they rub on clothing or jewelry.  Lesions can be in the way of shaving.  If they become inflamed, they can cause itching,  soreness, or burning.  Removal of a seborrheic keratosis can be accomplished by freezing, burning, or surgery. If any spot bleeds, scabs, or grows rapidly, please return to have it checked, as these can be an indication of a skin cancer.   Cryotherapy Aftercare  Wash gently with soap and water everyday.   Apply Vaseline and Band-Aid daily until healed.    Melanoma ABCDEs  Melanoma is the most dangerous type of skin cancer, and is the leading cause of death from skin disease.  You are more likely to develop melanoma if you: Have light-colored skin, light-colored eyes, or red or blond hair Spend a lot of time in the sun Tan regularly, either outdoors or in a tanning bed Have had blistering sunburns, especially during childhood Have a close family member who has had a melanoma Have atypical moles or large birthmarks  Early detection of melanoma is key since treatment is typically straightforward and cure rates are extremely high if we catch it early.   The first sign of melanoma is often a change in a mole or a new dark spot.  The ABCDE system is a way of remembering the signs of melanoma.  A for asymmetry:  The two halves do not match. B for border:  The edges of the growth are irregular. C for color:  A mixture of colors are present instead of an even brown color. D for diameter:  Melanomas are usually (but not always) greater than 6mm - the size of a pencil eraser. E for evolution:  The spot keeps changing in size, shape, and color.  Please check your skin once per month between visits. You can use a small mirror in front and a large mirror behind you to keep an eye on the back side or your body.   If you see any new or changing lesions before your next follow-up, please call to schedule a visit.  Please continue daily skin protection including broad spectrum sunscreen SPF 30+ to sun-exposed areas, reapplying every 2 hours as needed when you're outdoors.   Staying in the shade or  wearing long sleeves, sun glasses (UVA+UVB protection) and wide brim hats (4-inch brim around the entire circumference of the hat) are also recommended for sun protection.        Due to recent changes in healthcare laws, you may see results of your pathology and/or laboratory studies on MyChart before the doctors have had a chance to review them. We understand that in some cases there may be results that are confusing or concerning to you. Please understand that not all results are received at the same time and often the doctors may need to interpret multiple results in order to provide you with the best plan of care or course of treatment. Therefore, we ask that you   please give us 2 business days to thoroughly review all your results before contacting the office for clarification. Should we see a critical lab result, you will be contacted sooner.   If You Need Anything After Your Visit  If you have any questions or concerns for your doctor, please call our main line at [phone] and press option 4 to reach your doctor's medical assistant. If no one answers, please leave a voicemail as directed and we will return your call as soon as possible. Messages left after 4 pm will be answered the following business day.   You may also send us a message via MyChart. We typically respond to MyChart messages within 1-2 business days.  For prescription refills, please ask your pharmacy to contact our office. Our fax number is [phone].  If you have an urgent issue when the clinic is closed that cannot wait until the next business day, you can page your doctor at the number below.    Please note that while we do our best to be available for urgent issues outside of office hours, we are not available 24/7.   If you have an urgent issue and are unable to reach us, you may choose to seek medical care at your doctor's office, retail clinic, urgent care center, or emergency room.  If you have a medical  emergency, please immediately call 911 or go to the emergency department.  Pager Numbers  - Dr. Kowalski: [phone]  - Dr. Moye: [phone]  - Dr. Stewart: [phone]  In the event of inclement weather, please call our main line at [phone] for an update on the status of any delays or closures.  Dermatology Medication Tips: Please keep the boxes that topical medications come in in order to help keep track of the instructions about where and how to use these. Pharmacies typically print the medication instructions only on the boxes and not directly on the medication tubes.   If your medication is too expensive, please contact our office at [phone] option 4 or send us a message through MyChart.   We are unable to tell what your co-pay for medications will be in advance as this is different depending on your insurance coverage. However, we may be able to find a substitute medication at lower cost or fill out paperwork to get insurance to cover a needed medication.   If a prior authorization is required to get your medication covered by your insurance company, please allow us 1-2 business days to complete this process.  Drug prices often vary depending on where the prescription is filled and some pharmacies may offer cheaper prices.  The website www.goodrx.com contains coupons for medications through different pharmacies. The prices here do not account for what the cost may be with help from insurance (it may be cheaper with your insurance), but the website can give you the price if you did not use any insurance.  - You can print the associated coupon and take it with your prescription to the pharmacy.  - You may also stop by our office during regular business hours and pick up a GoodRx coupon card.  - If you need your prescription sent electronically to a different pharmacy, notify our office through [facility] MyChart or by phone at [phone] option 4.     Si Usted  Necesita Algo Despus de Su Visita  Tambin puede enviarnos un mensaje a travs de MyChart. Por lo general respondemos a los mensajes de MyChart en el transcurso de   1 a 2 das hbiles.  Para renovar recetas, por favor pida a su farmacia que se ponga en contacto con nuestra oficina. Nuestro nmero de fax es el [phone].  Si tiene un asunto urgente cuando la clnica est cerrada y que no puede esperar hasta el siguiente da hbil, puede llamar/localizar a su doctor(a) al nmero que aparece a continuacin.   Por favor, tenga en cuenta que aunque hacemos todo lo posible para estar disponibles para asuntos urgentes fuera del horario de oficina, no estamos disponibles las 24 horas del da, los 7 das de la semana.   Si tiene un problema urgente y no puede comunicarse con nosotros, puede optar por buscar atencin mdica  en el consultorio de su doctor(a), en una clnica privada, en un centro de atencin urgente o en una sala de emergencias.  Si tiene una emergencia mdica, por favor llame inmediatamente al 911 o vaya a la sala de emergencias.  Nmeros de bper  - Dr. Kowalski: [phone]  - Dra. Moye: [phone]  - Dra. Stewart: [phone]  En caso de inclemencias del tiempo, por favor llame a nuestra lnea principal al [phone] para una actualizacin sobre el estado de cualquier retraso o cierre.  Consejos para la medicacin en dermatologa: Por favor, guarde las cajas en las que vienen los medicamentos de uso tpico para ayudarle a seguir las instrucciones sobre dnde y cmo usarlos. Las farmacias generalmente imprimen las instrucciones del medicamento slo en las cajas y no directamente en los tubos del medicamento.   Si su medicamento es muy caro, por favor, pngase en contacto con nuestra oficina llamando al [phone] y presione la opcin 4 o envenos un mensaje a travs de MyChart.   No podemos decirle cul ser su copago por los medicamentos por adelantado ya que esto es  diferente dependiendo de la cobertura de su seguro. Sin embargo, es posible que podamos encontrar un medicamento sustituto a menor costo o llenar un formulario para que el seguro cubra el medicamento que se considera necesario.   Si se requiere una autorizacin previa para que su compaa de seguros cubra su medicamento, por favor permtanos de 1 a 2 das hbiles para completar este proceso.  Los precios de los medicamentos varan con frecuencia dependiendo del lugar de dnde se surte la receta y alguna farmacias pueden ofrecer precios ms baratos.  El sitio web www.goodrx.com tiene cupones para medicamentos de diferentes farmacias. Los precios aqu no tienen en cuenta lo que podra costar con la ayuda del seguro (puede ser ms barato con su seguro), pero el sitio web puede darle el precio si no utiliz ningn seguro.  - Puede imprimir el cupn correspondiente y llevarlo con su receta a la farmacia.  - Tambin puede pasar por nuestra oficina durante el horario de atencin regular y recoger una tarjeta de cupones de GoodRx.  - Si necesita que su receta se enve electrnicamente a una farmacia diferente, informe a nuestra oficina a travs de [facility] o por telfono llamando al [phone] y presione la opcin 4.  

## 2021-10-10 ENCOUNTER — Telehealth: Payer: Self-pay

## 2021-10-10 NOTE — Telephone Encounter (Signed)
Advised pt of bx results/sh ?

## 2021-10-10 NOTE — Telephone Encounter (Signed)
-----   Message from Alfonso Patten, MD sent at 10/10/2021 10:38 AM EDT ----- 1. Skin , right posterior neck ACROCHORDON Skin tag, benign, no additional treatment needed.    2. Skin , left mid back HEMANGIOMA Benign blood vessel growth, no treatment needed  MAs please call. Thank you!

## 2021-10-13 NOTE — Addendum Note (Signed)
Addended by: Brendolyn Patty on: 10/13/2021 10:22 AM   Modules accepted: Level of Service

## 2021-10-19 DIAGNOSIS — Z86010 Personal history of colonic polyps: Secondary | ICD-10-CM | POA: Diagnosis not present

## 2021-10-23 DIAGNOSIS — E785 Hyperlipidemia, unspecified: Secondary | ICD-10-CM | POA: Diagnosis not present

## 2021-10-23 DIAGNOSIS — I10 Essential (primary) hypertension: Secondary | ICD-10-CM | POA: Diagnosis not present

## 2021-10-30 DIAGNOSIS — R32 Unspecified urinary incontinence: Secondary | ICD-10-CM | POA: Diagnosis not present

## 2021-10-30 DIAGNOSIS — I10 Essential (primary) hypertension: Secondary | ICD-10-CM | POA: Diagnosis not present

## 2021-10-30 DIAGNOSIS — D72820 Lymphocytosis (symptomatic): Secondary | ICD-10-CM | POA: Diagnosis not present

## 2021-10-30 DIAGNOSIS — Z0000 Encounter for general adult medical examination without abnormal findings: Secondary | ICD-10-CM | POA: Diagnosis not present

## 2021-10-31 DIAGNOSIS — H6123 Impacted cerumen, bilateral: Secondary | ICD-10-CM | POA: Diagnosis not present

## 2021-12-05 DIAGNOSIS — R197 Diarrhea, unspecified: Secondary | ICD-10-CM | POA: Diagnosis not present

## 2021-12-07 ENCOUNTER — Encounter: Payer: Self-pay | Admitting: Gastroenterology

## 2021-12-07 NOTE — H&P (Signed)
Pre-Procedure H&P   Patient ID: Katherine Gallegos is a 81 y.o. female.  Gastroenterology Provider: Annamaria Helling, DO  Referring Provider: Dawson Bills, NP PCP: [place] Pink, MD  Date: 12/08/2021  HPI Katherine Gallegos is a 81 y.o. female who presents today for Esophagogastroduodenoscopy and Colonoscopy for Surveillance gastric metaplasia and personal history of colon polyps; family history colorectal cancer.  She reports bowel movement approximately every 3 days without melena or hematochezia  Patient with a personal history of colon polyps noted in 2018 and 2014 measuring 8 to 15 mm-sessile serrated polyps.  Also noted to have diverticulosis and internal hemorrhoids.  Patient underwent EGD and 2019 for dysphagia with Schatzki's ring noted dilated with through-the-scope balloon to 18, 19, 20 mm.  The ring was dilated but not fractured.  Biopsies of the stomach were negative for H. pylori but did demonstrate some gastric intestinal metaplasia  Labs 13.8 hemoglobin, MCV 88 platelets 249,000 creatinine 0.7  Brother with history of colon cancer in 30s   Past Medical History:  Diagnosis Date   Allergic state    Arthritis    FH: colon polyps    Hypertension    Skin cancer     Past Surgical History:  Procedure Laterality Date   ABDOMINAL HYSTERECTOMY  1977   Partial   APPENDECTOMY  1977   BREAST BIOPSY Bilateral 20 + yrs    excisional bx. negative results   BREAST BIOPSY Left 04/02/2016   benign-FIBROCYSTIC CHANGE   COLON SURGERY  2014   Large Polypectomy with Bowel Resection (Dr. Rochel Brome)   COLONOSCOPY WITH ESOPHAGOGASTRODUODENOSCOPY (EGD)     COLONOSCOPY WITH PROPOFOL N/A 06/13/2016   Procedure: COLONOSCOPY WITH PROPOFOL; adenomatous polyps Surgeon: Manya Silvas, MD;  Location: [facility] ENDOSCOPY;  Service: Endoscopy;  Laterality: N/A;   ESOPHAGOGASTRODUODENOSCOPY (EGD) WITH PROPOFOL N/A 08/21/2017   Procedure: ESOPHAGOGASTRODUODENOSCOPY (EGD) WITH PROPOFOL;   Surgeon: Manya Silvas, MD;  Location: [facility] ENDOSCOPY;  Service: Endoscopy;  Laterality: N/A;   VEIN SURGERY  2010    Family History Brother-colon cancer No other h/o GI disease or malignancy  Review of Systems  Constitutional:  Negative for activity change, appetite change, chills, diaphoresis, fatigue, fever and unexpected weight change.  HENT:  Negative for trouble swallowing and voice change.   Respiratory:  Negative for shortness of breath and wheezing.   Cardiovascular:  Negative for chest pain, palpitations and leg swelling.  Gastrointestinal:  Negative for abdominal distention, abdominal pain, anal bleeding, blood in stool, constipation, diarrhea, nausea, rectal pain and vomiting.  Musculoskeletal:  Negative for arthralgias and myalgias.  Skin:  Negative for color change and pallor.  Neurological:  Negative for dizziness, syncope and weakness.  Psychiatric/Behavioral:  Negative for confusion.   All other systems reviewed and are negative.    Medications No current facility-administered medications on file prior to encounter.   Current Outpatient Medications on File Prior to Encounter  Medication Sig Dispense Refill   amLODipine (NORVASC) 5 MG tablet Take 7.5 mg by mouth daily.     metoprolol tartrate (LOPRESSOR) 25 MG tablet Take 25 mg by mouth 2 (two) times daily.     acetaminophen (TYLENOL) 325 MG tablet Take 650 mg by mouth every 6 (six) hours as needed.     conjugated estrogens (PREMARIN) vaginal cream Place 1 Applicatorful vaginally daily. Apply 0.'5mg'$  (pea-sized amount)  just inside the vaginal introitus with a finger-tip every night for two weeks and then Monday, Wednesday and Friday nights. (Patient not taking:  Reported on 08/21/2017) 30 g 12    Pertinent medications related to GI and procedure were reviewed by me with the patient prior to the procedure   Current Facility-Administered Medications:    0.9 %  sodium chloride infusion, , Intravenous, Continuous,  Annamaria Helling, DO, Last Rate: 20 mL/hr at 12/08/21 0853, New Bag at 12/08/21 0853      Allergies  Allergen Reactions   Hydrochlorothiazide Rash   Latex Rash   Lovastatin Other (See Comments)    Muscle pain   Allergies were reviewed by me prior to the procedure  Objective   Body mass index is 28.23 kg/m. Vitals:   12/08/21 0834  BP: (!) 150/74  Pulse: 69  Resp: 18  Temp: 97.6 F (36.4 C)  TempSrc: Temporal  SpO2: 97%  Weight: 70 kg  Height: '5\' 2"'$  (1.575 m)     Physical Exam Vitals and nursing note reviewed.  Constitutional:      General: She is not in acute distress.    Appearance: Normal appearance. She is not ill-appearing, toxic-appearing or diaphoretic.  HENT:     Head: Normocephalic and atraumatic.     Nose: Nose normal.     Mouth/Throat:     Mouth: Mucous membranes are moist.     Pharynx: Oropharynx is clear.  Eyes:     General: No scleral icterus.    Extraocular Movements: Extraocular movements intact.  Cardiovascular:     Rate and Rhythm: Normal rate and regular rhythm.     Heart sounds: Normal heart sounds. No murmur heard.    No friction rub. No gallop.  Pulmonary:     Effort: Pulmonary effort is normal. No respiratory distress.     Breath sounds: Normal breath sounds. No wheezing, rhonchi or rales.  Abdominal:     General: Abdomen is flat. Bowel sounds are normal. There is no distension.     Palpations: Abdomen is soft.     Tenderness: There is no abdominal tenderness. There is no guarding or rebound.  Musculoskeletal:     Cervical back: Neck supple.     Right lower leg: No edema.     Left lower leg: No edema.  Skin:    General: Skin is warm and dry.     Coloration: Skin is not jaundiced or pale.  Neurological:     General: No focal deficit present.     Mental Status: She is alert and oriented to person, place, and time. Mental status is at baseline.  Psychiatric:        Mood and Affect: Mood normal.        Behavior: Behavior  normal.        Thought Content: Thought content normal.        Judgment: Judgment normal.      Assessment:  Katherine Gallegos is a 81 y.o. female  who presents today for Esophagogastroduodenoscopy and Colonoscopy for Surveillance gastric metaplasia and personal history of colon polyps; family history colorectal cancer.  Plan:  Esophagogastroduodenoscopy and Colonoscopy with possible intervention today  Esophagogastroduodenoscopy and Colonoscopy with possible biopsy, control of bleeding, polypectomy, and interventions as necessary has been discussed with the patient/patient representative. Informed consent was obtained from the patient/patient representative after explaining the indication, nature, and risks of the procedure including but not limited to death, bleeding, perforation, missed neoplasm/lesions, cardiorespiratory compromise, and reaction to medications. Opportunity for questions was given and appropriate answers were provided. Patient/patient representative has verbalized understanding is amenable to undergoing the procedure.  Annamaria Helling, DO  [facility] Gastroenterology  Portions of the record may have been created with voice recognition software. Occasional wrong-word or 'sound-a-like' substitutions may have occurred due to the inherent limitations of voice recognition software.  Read the chart carefully and recognize, using context, where substitutions may have occurred.

## 2021-12-08 ENCOUNTER — Encounter
Admission: RE | Disposition: A | Discharge status: Home / Self Care | Payer: Self-pay | Source: Home / Self Care | Attending: Gastroenterology

## 2021-12-08 ENCOUNTER — Ambulatory Visit: Payer: PPO | Admitting: Anesthesiology

## 2021-12-08 ENCOUNTER — Other Ambulatory Visit: Payer: Self-pay

## 2021-12-08 ENCOUNTER — Encounter: Payer: Self-pay | Admitting: Gastroenterology

## 2021-12-08 ENCOUNTER — Ambulatory Visit
Admission: RE | Admit: 2021-12-08 | Discharge: 2021-12-08 | Disposition: A | Discharge status: Home / Self Care | Payer: PPO | Attending: Gastroenterology | Admitting: Gastroenterology

## 2021-12-08 DIAGNOSIS — Z8371 Family history of colonic polyps: Secondary | ICD-10-CM | POA: Diagnosis not present

## 2021-12-08 DIAGNOSIS — Z800 Family history of malignant neoplasm of digestive organs: Secondary | ICD-10-CM | POA: Diagnosis not present

## 2021-12-08 DIAGNOSIS — Z86010 Personal history of colonic polyps: Secondary | ICD-10-CM | POA: Diagnosis not present

## 2021-12-08 DIAGNOSIS — K2950 Unspecified chronic gastritis without bleeding: Secondary | ICD-10-CM | POA: Diagnosis not present

## 2021-12-08 DIAGNOSIS — D126 Benign neoplasm of colon, unspecified: Secondary | ICD-10-CM | POA: Diagnosis not present

## 2021-12-08 DIAGNOSIS — I10 Essential (primary) hypertension: Secondary | ICD-10-CM | POA: Insufficient documentation

## 2021-12-08 DIAGNOSIS — K5730 Diverticulosis of large intestine without perforation or abscess without bleeding: Secondary | ICD-10-CM | POA: Diagnosis not present

## 2021-12-08 DIAGNOSIS — Z1211 Encounter for screening for malignant neoplasm of colon: Secondary | ICD-10-CM | POA: Diagnosis not present

## 2021-12-08 DIAGNOSIS — K449 Diaphragmatic hernia without obstruction or gangrene: Secondary | ICD-10-CM | POA: Insufficient documentation

## 2021-12-08 DIAGNOSIS — M1990 Unspecified osteoarthritis, unspecified site: Secondary | ICD-10-CM | POA: Diagnosis not present

## 2021-12-08 DIAGNOSIS — K621 Rectal polyp: Secondary | ICD-10-CM | POA: Diagnosis not present

## 2021-12-08 DIAGNOSIS — Z8249 Family history of ischemic heart disease and other diseases of the circulatory system: Secondary | ICD-10-CM | POA: Insufficient documentation

## 2021-12-08 DIAGNOSIS — K635 Polyp of colon: Secondary | ICD-10-CM | POA: Insufficient documentation

## 2021-12-08 DIAGNOSIS — D124 Benign neoplasm of descending colon: Secondary | ICD-10-CM | POA: Insufficient documentation

## 2021-12-08 DIAGNOSIS — K6389 Other specified diseases of intestine: Secondary | ICD-10-CM | POA: Diagnosis not present

## 2021-12-08 DIAGNOSIS — K640 First degree hemorrhoids: Secondary | ICD-10-CM | POA: Diagnosis not present

## 2021-12-08 DIAGNOSIS — D128 Benign neoplasm of rectum: Secondary | ICD-10-CM | POA: Diagnosis not present

## 2021-12-08 DIAGNOSIS — K31A0 Gastric intestinal metaplasia, unspecified: Secondary | ICD-10-CM | POA: Diagnosis not present

## 2021-12-08 DIAGNOSIS — K2960 Other gastritis without bleeding: Secondary | ICD-10-CM | POA: Diagnosis not present

## 2021-12-08 DIAGNOSIS — K222 Esophageal obstruction: Secondary | ICD-10-CM | POA: Insufficient documentation

## 2021-12-08 DIAGNOSIS — K3189 Other diseases of stomach and duodenum: Secondary | ICD-10-CM | POA: Diagnosis not present

## 2021-12-08 HISTORY — PX: ESOPHAGOGASTRODUODENOSCOPY: SHX5428

## 2021-12-08 HISTORY — PX: COLONOSCOPY: SHX5424

## 2021-12-08 SURGERY — COLONOSCOPY
Anesthesia: General

## 2021-12-08 MED ORDER — PHENYLEPHRINE 80 MCG/ML (10ML) SYRINGE FOR IV PUSH (FOR BLOOD PRESSURE SUPPORT)
PREFILLED_SYRINGE | INTRAVENOUS | Status: DC | PRN
  Administered 2021-12-08 (×3): 80 ug via INTRAVENOUS

## 2021-12-08 MED ORDER — PROPOFOL 10 MG/ML IV BOLUS
INTRAVENOUS | Status: DC | PRN
  Administered 2021-12-08 (×2): 40 mg via INTRAVENOUS
  Administered 2021-12-08: 100 mg via INTRAVENOUS
  Administered 2021-12-08 (×5): 40 mg via INTRAVENOUS

## 2021-12-08 MED ORDER — SODIUM CHLORIDE 0.9 % IV SOLN: INTRAVENOUS | Status: DC

## 2021-12-08 MED ORDER — LIDOCAINE HCL (CARDIAC) PF 100 MG/5ML IV SOSY
PREFILLED_SYRINGE | INTRAVENOUS | Status: DC | PRN
  Administered 2021-12-08: 80 mg via INTRAVENOUS

## 2021-12-08 NOTE — Transfer of Care (Signed)
Immediate Anesthesia Transfer of Care Note  Patient: Katherine Gallegos  Procedure(s) Performed: COLONOSCOPY ESOPHAGOGASTRODUODENOSCOPY (EGD)  Patient Location: Endoscopy Unit  Anesthesia Type:General  Level of Consciousness: drowsy  Airway & Oxygen Therapy: Patient Spontanous Breathing and Patient connected to nasal cannula oxygen  Post-op Assessment: Report given to RN, Post -op Vital signs reviewed and stable and Patient moving all extremities  Post vital signs: Reviewed and stable  Last Vitals:  Vitals Value Taken Time  BP 101/49 12/08/21 1018  Temp    Pulse 77 12/08/21 1018  Resp 12 12/08/21 1018  SpO2 100 % 12/08/21 1018  Vitals shown include unvalidated device data.  Last Pain:  Vitals:   12/08/21 0834  TempSrc: Temporal  PainSc: 0-No pain         Complications: No notable events documented.

## 2021-12-08 NOTE — Op Note (Signed)
[facility] Gastroenterology Patient Name: Katherine Gallegos Procedure Date: 12/08/2021 9:06 AM MRN: [id number] Account #: [id number] Date of Birth: 07-02-1940 Admit Type: Outpatient Age: 81 Room: [facility] ENDO ROOM 1 Gender: Female Note Status: Finalized Instrument Name: Colonoscope [id number] Procedure:             Colonoscopy Indications:           High risk colon cancer surveillance: Personal history                         of colonic polyps Providers:             Rueben Bash, DO Referring MD:          Annamaria Helling DO, DO (Referring MD), No Local                         Md, MD (Referring MD) Medicines:             Monitored Anesthesia Care Complications:         No immediate complications. Estimated blood loss:                         Minimal. Procedure:             Pre-Anesthesia Assessment:                        - Prior to the procedure, a History and Physical was                         performed, and patient medications and allergies were                         reviewed. The patient is competent. The risks and                         benefits of the procedure and the sedation options and                         risks were discussed with the patient. All questions                         were answered and informed consent was obtained.                         Patient identification and proposed procedure were                         verified by the physician, the nurse, the anesthetist                         and the technician in the endoscopy suite. Mental                         Status Examination: alert and oriented. Airway                         Examination: normal oropharyngeal airway and neck  mobility. Respiratory Examination: clear to                         auscultation. CV Examination: RRR, no murmurs, no S3                         or S4. Prophylactic Antibiotics: The patient does not                          require prophylactic antibiotics. Prior                         Anticoagulants: The patient has taken no previous                         anticoagulant or antiplatelet agents. ASA Grade                         Assessment: II - A patient with mild systemic disease.                         After reviewing the risks and benefits, the patient                         was deemed in satisfactory condition to undergo the                         procedure. The anesthesia plan was to use monitored                         anesthesia care (MAC). Immediately prior to                         administration of medications, the patient was                         re-assessed for adequacy to receive sedatives. The                         heart rate, respiratory rate, oxygen saturations,                         blood pressure, adequacy of pulmonary ventilation, and                         response to care were monitored throughout the                         procedure. The physical status of the patient was                         re-assessed after the procedure.                        After obtaining informed consent, the colonoscope was                         passed under direct vision. Throughout the procedure,  the patient's blood pressure, pulse, and oxygen                         saturations were monitored continuously. The                         Colonoscope was introduced through the anus and                         advanced to the the ileocolonic anastomosis. The                         colonoscopy was somewhat difficult due to a tortuous                         colon. Successful completion of the procedure was                         aided by straightening and shortening the scope to                         obtain bowel loop reduction and applying abdominal                         pressure. The patient tolerated the procedure well.                         The quality of the  bowel preparation was evaluated                         using the BBPS [facility] Bowel Preparation Scale) with                         scores of: Right Colon = 3, Transverse Colon = 3 and                         Left Colon = 3 (entire mucosa seen well with no                         residual staining, small fragments of stool or opaque                         liquid). The total BBPS score equals 9. Neoileum were                         photographed. Findings:      The perianal and digital rectal examinations were normal. Pertinent       negatives include normal sphincter tone.      The neo-terminal ileum appeared normal. Estimated blood loss: none.      Multiple small-mouthed diverticula were found in the entire colon.       Estimated blood loss: none.      Three sessile polyps were found in the descending colon. The polyps were       1 to 2 mm in size. These polyps were removed with a jumbo cold forceps.       Resection and retrieval were complete. Estimated blood loss was minimal.  A 4 to 5 mm polyp was found in the rectum. The polyp was sessile. The       polyp was removed with a cold snare. Resection and retrieval were       complete. Estimated blood loss was minimal.      Non-bleeding internal hemorrhoids were found during retroflexion. The       hemorrhoids were Grade I (internal hemorrhoids that do not prolapse).       Estimated blood loss: none.      The exam was otherwise without abnormality on direct and retroflexion       views. Impression:            - The examined portion of the ileum was normal.                        - Diverticulosis in the entire examined colon.                        - Three 1 to 2 mm polyps in the descending colon,                         removed with a jumbo cold forceps. Resected and                         retrieved.                        - One 4 to 5 mm polyp in the rectum, removed with a                         cold snare. Resected and  retrieved.                        - Non-bleeding internal hemorrhoids.                        - The examination was otherwise normal on direct and                         retroflexion views. Recommendation:        - Patient has a contact number available for                         emergencies. The signs and symptoms of potential                         delayed complications were discussed with the patient.                         Return to normal activities tomorrow. Written                         discharge instructions were provided to the patient.                        - Discharge patient to home.                        - Resume previous diet.                        -  Continue present medications.                        - No aspirin, ibuprofen, naproxen, or other                         non-steroidal anti-inflammatory drugs for 5 days after                         polyp removal.                        - Await pathology results.                        - Repeat colonoscopy for surveillance based on                         pathology results.                        - Return to GI office as previously scheduled.                        - The findings and recommendations were discussed with                         the patient. Procedure Code(s):     --- Professional ---                        [phone], Colonoscopy, flexible; with removal of                         tumor(s), polyp(s), or other lesion(s) by snare                         technique                        [id number], 23, Colonoscopy, flexible; with biopsy, single                         or multiple Diagnosis Code(s):     --- Professional ---                        Z86.010, Personal history of colonic polyps                        K64.0, First degree hemorrhoids                        K63.5, Polyp of colon                        K62.1, Rectal polyp                        K57.30, Diverticulosis of large intestine without                          perforation or abscess without bleeding CPT copyright 2019 American [employer]. All rights reserved. The codes documented  in this report are preliminary and upon coder review may  be revised to meet current compliance requirements. Attending Participation:      I personally performed the entire procedure. Volney American, DO Annamaria Helling DO, DO 12/08/2021 10:24:00 AM This report has been signed electronically. Number of Addenda: 0 Note Initiated On: 12/08/2021 9:06 AM Scope Withdrawal Time: 0 hours 26 minutes 57 seconds  Total Procedure Duration: 0 hours 35 minutes 29 seconds  Estimated Blood Loss:  Estimated blood loss was minimal.      [facility]

## 2021-12-08 NOTE — Op Note (Signed)
[facility] Gastroenterology Patient Name: Katherine Gallegos Procedure Date: 12/08/2021 9:07 AM MRN: [id number] Account #: [id number] Date of Birth: [birth year] Admit Type: Outpatient Age: 81 Room: [facility] ENDO ROOM 1 Gender: Female Note Status: Finalized Instrument Name: Upper Endoscope [id number] Procedure:             Upper GI endoscopy Indications:           Follow-up of intestinal metaplasia Providers:             Rueben Bash, DO Referring MD:          Annamaria Helling DO, DO (Referring MD), No Local                         Md, MD (Referring MD) Medicines:             Monitored Anesthesia Care Complications:         No immediate complications. Estimated blood loss:                         Minimal. Procedure:             Pre-Anesthesia Assessment:                        - Prior to the procedure, a History and Physical was                         performed, and patient medications and allergies were                         reviewed. The patient is competent. The risks and                         benefits of the procedure and the sedation options and                         risks were discussed with the patient. All questions                         were answered and informed consent was obtained.                         Patient identification and proposed procedure were                         verified by the physician, the nurse, the anesthetist                         and the technician in the endoscopy suite. Mental                         Status Examination: alert and oriented. Airway                         Examination: normal oropharyngeal airway and neck                         mobility. Respiratory Examination: clear to  auscultation. CV Examination: RRR, no murmurs, no S3                         or S4. Prophylactic Antibiotics: The patient does not                         require prophylactic antibiotics. Prior                          Anticoagulants: The patient has taken no previous                         anticoagulant or antiplatelet agents. ASA Grade                         Assessment: II - A patient with mild systemic disease.                         After reviewing the risks and benefits, the patient                         was deemed in satisfactory condition to undergo the                         procedure. The anesthesia plan was to use monitored                         anesthesia care (MAC). Immediately prior to                         administration of medications, the patient was                         re-assessed for adequacy to receive sedatives. The                         heart rate, respiratory rate, oxygen saturations,                         blood pressure, adequacy of pulmonary ventilation, and                         response to care were monitored throughout the                         procedure. The physical status of the patient was                         re-assessed after the procedure.                        After obtaining informed consent, the endoscope was                         passed under direct vision. Throughout the procedure,                         the patient's blood pressure, pulse, and oxygen  saturations were monitored continuously. The Endoscope                         was introduced through the mouth, and advanced to the                         second part of duodenum. The upper GI endoscopy was                         accomplished without difficulty. The patient tolerated                         the procedure well. Findings:      The duodenal bulb, first portion of the duodenum and second portion of       the duodenum were normal. Estimated blood loss: none.      The entire examined stomach was normal. Biopsies were taken with a cold       forceps for histology. Taken from the antrum, incisura and body       separately ( 2 total bottles)  Estimated blood loss was minimal.      A small hiatal hernia was present.      A medium-sized hernia was found. Estimated blood loss: none.      The Z-line was regular. Estimated blood loss: none.      A widely patent Schatzki ring was found at the gastroesophageal       junction. Estimated blood loss: none.      The exam of the esophagus was otherwise normal. Impression:            - Normal duodenal bulb, first portion of the duodenum                         and second portion of the duodenum.                        - Normal stomach. Biopsied.                        - Small hiatal hernia.                        - Medium-sized paraesophageal hernia.                        - Z-line regular.                        - Widely patent Schatzki ring. Recommendation:        - Patient has a contact number available for                         emergencies. The signs and symptoms of potential                         delayed complications were discussed with the patient.                         Return to normal activities tomorrow. Written  discharge instructions were provided to the patient.                        - Discharge patient to home.                        - Resume previous diet.                        - Continue present medications.                        - Await pathology results.                        - Return to GI clinic as previously scheduled.                        - proceed with colonoscopy                        - The findings and recommendations were discussed with                         the patient. Procedure Code(s):     --- Professional ---                        [phone], Esophagogastroduodenoscopy, flexible,                         transoral; with biopsy, single or multiple Diagnosis Code(s):     --- Professional ---                        K44.9, Diaphragmatic hernia without obstruction or                         gangrene                         K22.2, Esophageal obstruction                        K31.89, Other diseases of stomach and duodenum CPT copyright 2019 American Medical Association. All rights reserved. The codes documented in this report are preliminary and upon coder review may  be revised to meet current compliance requirements. Attending Participation:      I personally performed the entire procedure. Volney American, DO Annamaria Helling DO, DO 12/08/2021 9:37:47 AM This report has been signed electronically. Number of Addenda: 0 Note Initiated On: 12/08/2021 9:07 AM Estimated Blood Loss:  Estimated blood loss was minimal.      [facility]

## 2021-12-08 NOTE — Anesthesia Postprocedure Evaluation (Signed)
Anesthesia Post Note  Patient: Katherine Gallegos  Procedure(s) Performed: COLONOSCOPY ESOPHAGOGASTRODUODENOSCOPY (EGD)  Patient location during evaluation: Endoscopy Anesthesia Type: General Level of consciousness: awake and alert Pain management: pain level controlled Vital Signs Assessment: post-procedure vital signs reviewed and stable Respiratory status: spontaneous breathing, nonlabored ventilation, respiratory function stable and patient connected to nasal cannula oxygen Cardiovascular status: blood pressure returned to baseline and stable Postop Assessment: no apparent nausea or vomiting Anesthetic complications: no   No notable events documented.   Last Vitals:  Vitals:   12/08/21 1028 12/08/21 1038  BP: 90/67 97/79  Pulse: 80 70  Resp: 19 16  Temp:    SpO2: 94% 96%    Last Pain:  Vitals:   12/08/21 1038  TempSrc:   PainSc: 0-No pain                 Ilene Qua

## 2021-12-08 NOTE — Anesthesia Preprocedure Evaluation (Signed)
Anesthesia Evaluation  Patient identified by MRN, date of birth, ID band Patient awake    Reviewed: Allergy & Precautions, NPO status , Patient's Chart, lab work & pertinent test results  Airway Mallampati: II  TM Distance: >3 FB Neck ROM: Full    Dental no notable dental hx.    Pulmonary neg pulmonary ROS,    Pulmonary exam normal breath sounds clear to auscultation       Cardiovascular hypertension, Normal cardiovascular exam Rhythm:Regular Rate:Normal     Neuro/Psych negative neurological ROS  negative psych ROS   GI/Hepatic negative GI ROS, Neg liver ROS,   Endo/Other  negative endocrine ROS  Renal/GU negative Renal ROS  negative genitourinary   Musculoskeletal  (+) Arthritis ,   Abdominal   Peds negative pediatric ROS (+)  Hematology negative hematology ROS (+)   Anesthesia Other Findings   Reproductive/Obstetrics negative OB ROS                             Anesthesia Physical Anesthesia Plan  ASA: 2  Anesthesia Plan: General   Post-op Pain Management: Minimal or no pain anticipated   Induction: Intravenous  PONV Risk Score and Plan: 3 and Propofol infusion and TIVA  Airway Management Planned: Natural Airway and Nasal Cannula  Additional Equipment:   Intra-op Plan:   Post-operative Plan:   Informed Consent: I have reviewed the patients History and Physical, chart, labs and discussed the procedure including the risks, benefits and alternatives for the proposed anesthesia with the patient or authorized representative who has indicated his/her understanding and acceptance.     Dental Advisory Given  Plan Discussed with: Anesthesiologist, CRNA and Surgeon  Anesthesia Plan Comments: (Patient consented for risks of anesthesia including but not limited to:  - adverse reactions to medications - risk of airway placement if required - damage to eyes, teeth, lips or other  oral mucosa - nerve damage due to positioning  - sore throat or hoarseness - Damage to heart, brain, nerves, lungs, other parts of body or loss of life  Patient voiced understanding.)        Anesthesia Quick Evaluation

## 2021-12-08 NOTE — Interval H&P Note (Signed)
History and Physical Interval Note: Preprocedure H&P from 12/08/21  was reviewed and there was no interval change after seeing and examining the patient.  Written consent was obtained from the patient after discussion of risks, benefits, and alternatives. Patient has consented to proceed with Esophagogastroduodenoscopy and Colonoscopy with possible intervention   12/08/2021 9:18 AM  Katherine Gallegos  has presented today for surgery, with the diagnosis of Hx Polyps Gastric Intestinal Metaplasia without Dysplasia.  The various methods of treatment have been discussed with the patient and family. After consideration of risks, benefits and other options for treatment, the patient has consented to  Procedure(s): COLONOSCOPY (N/A) ESOPHAGOGASTRODUODENOSCOPY (EGD) (N/A) as a surgical intervention.  The patient's history has been reviewed, patient examined, no change in status, stable for surgery.  I have reviewed the patient's chart and labs.  Questions were answered to the patient's satisfaction.     Annamaria Helling

## 2021-12-11 ENCOUNTER — Encounter: Payer: Self-pay | Admitting: Gastroenterology

## 2021-12-12 LAB — SURGICAL PATHOLOGY

## 2021-12-14 DIAGNOSIS — R197 Diarrhea, unspecified: Secondary | ICD-10-CM | POA: Diagnosis not present

## 2021-12-19 ENCOUNTER — Ambulatory Visit (INDEPENDENT_AMBULATORY_CARE_PROVIDER_SITE_OTHER): Payer: PPO | Admitting: Vascular Surgery

## 2021-12-22 ENCOUNTER — Ambulatory Visit
Admission: RE | Admit: 2021-12-22 | Discharge: 2021-12-22 | Disposition: A | Discharge status: Home / Self Care | Payer: PPO | Source: Ambulatory Visit | Attending: Family Medicine | Admitting: Family Medicine

## 2021-12-22 DIAGNOSIS — Z1231 Encounter for screening mammogram for malignant neoplasm of breast: Secondary | ICD-10-CM

## 2022-01-19 SURGERY — EGD (ESOPHAGOGASTRODUODENOSCOPY)
Anesthesia: General

## 2022-02-01 ENCOUNTER — Encounter: Payer: PPO | Admitting: Internal Medicine

## 2022-02-26 ENCOUNTER — Ambulatory Visit (INDEPENDENT_AMBULATORY_CARE_PROVIDER_SITE_OTHER): Payer: PPO | Admitting: Internal Medicine

## 2022-02-26 ENCOUNTER — Encounter: Payer: Self-pay | Admitting: Internal Medicine

## 2022-02-26 VITALS — BP 134/86 | HR 71 | Temp 97.9°F | Ht 62.0 in | Wt 155.0 lb

## 2022-02-26 DIAGNOSIS — I10 Essential (primary) hypertension: Secondary | ICD-10-CM

## 2022-02-26 DIAGNOSIS — M1990 Unspecified osteoarthritis, unspecified site: Secondary | ICD-10-CM

## 2022-02-26 DIAGNOSIS — I872 Venous insufficiency (chronic) (peripheral): Secondary | ICD-10-CM

## 2022-02-26 DIAGNOSIS — K2100 Gastro-esophageal reflux disease with esophagitis, without bleeding: Secondary | ICD-10-CM

## 2022-02-26 DIAGNOSIS — K219 Gastro-esophageal reflux disease without esophagitis: Secondary | ICD-10-CM | POA: Insufficient documentation

## 2022-02-26 MED ORDER — LOSARTAN POTASSIUM 50 MG PO TABS: 50.0000 mg | ORAL_TABLET | Freq: Every day | ORAL | 3 refills | Status: DC

## 2022-02-26 MED ORDER — OMEPRAZOLE 20 MG PO CPDR: 20.0000 mg | DELAYED_RELEASE_CAPSULE | Freq: Every day | ORAL | 3 refills | Status: DC

## 2022-02-26 NOTE — Assessment & Plan Note (Signed)
Uses tylenol rarely for this

## 2022-02-26 NOTE — Patient Instructions (Signed)
Please stop the metoprolol Reduce the amlodipine to '5mg'$  daily (just one tab daily) Start the new medicaiton---losartan '50mg'$  daily

## 2022-02-26 NOTE — Assessment & Plan Note (Addendum)
BP Readings from Last 3 Encounters:  02/26/22 134/86  12/08/21 97/79  12/20/20 133/75   She is concerned about side effects---maybe to both of them Discussed alternatives Will stop the metoprolol Cut amlodipine to 5 mg daily (was taking 7.5) Add losartan '50mg'$  daily

## 2022-02-26 NOTE — Assessment & Plan Note (Signed)
Explained that this is likely the reason for her hoarseness Will restart the omeprazole '20mg'$  daily

## 2022-02-26 NOTE — Assessment & Plan Note (Signed)
May be mostly related to amlodipine---will reduce the dose '5mg'$  daily

## 2022-02-26 NOTE — Progress Notes (Signed)
Subjective:    Patient ID: Katherine Gallegos, female    DOB: 09-27-1940, 81 y.o.   MRN: [id number]  HPI Here to establish care Had been seeing Dr Hedrick---concerned that she is having some side effects from medication Has gained some weight Ongoing hoarseness Thinks it is related to the metoprolol Also notes some leg swelling--discussed this would be from the amlodipine  Reflux also a problem Hoarseness goes back a long way No dysphagia--but did see Dr Tami Ribas in the past for troubles Had EGD earlier this year---told to stay on omeprazole 20 (?3 months). She has a hard time telling if it helped the hoarseness--ran out a week ago and just using pepto-bismol (and symptoms worsened)  No chest pain or SOB Did have lightheaded spell and passed out about 5 years ago--no problems since then Edema more on right than left---does wear support hose  Some arthritis Rarely uses tylenol  Did see Dr Dew--told she has "leaky veins"  Current Outpatient Medications on File Prior to Visit  Medication Sig Dispense Refill   acetaminophen (TYLENOL) 325 MG tablet Take 650 mg by mouth every 6 (six) hours as needed.     amLODipine (NORVASC) 5 MG tablet Take 7.5 mg by mouth daily.     metoprolol tartrate (LOPRESSOR) 25 MG tablet Take 25 mg by mouth 2 (two) times daily.     omeprazole (PRILOSEC) 20 MG capsule Take 20 mg by mouth daily.     No current facility-administered medications on file prior to visit.    Allergies  Allergen Reactions   Hydrochlorothiazide Rash   Latex Rash   Lovastatin Other (See Comments)    Muscle pain    Past Medical History:  Diagnosis Date   Allergic state    Arthritis    FH: colon polyps    GERD (gastroesophageal reflux disease)    Hypertension    Skin cancer     Past Surgical History:  Procedure Laterality Date   ABDOMINAL HYSTERECTOMY  1977   Partial   APPENDECTOMY  1977   BREAST BIOPSY Bilateral 20 + yrs    excisional bx. negative results   BREAST  BIOPSY Left 04/02/2016   benign-FIBROCYSTIC CHANGE   COLON SURGERY  2014   Large Polypectomy with Bowel Resection (Dr. Rochel Brome)   COLONOSCOPY N/A 12/08/2021   Procedure: COLONOSCOPY;  Surgeon: Annamaria Helling, DO;  Location: [place];  Service: Gastroenterology;  Laterality: N/A;   COLONOSCOPY WITH ESOPHAGOGASTRODUODENOSCOPY (EGD)     COLONOSCOPY WITH PROPOFOL N/A 06/13/2016   Procedure: COLONOSCOPY WITH PROPOFOL; adenomatous polyps Surgeon: Manya Silvas, MD;  Location: [facility] ENDOSCOPY;  Service: Endoscopy;  Laterality: N/A;   ESOPHAGOGASTRODUODENOSCOPY N/A 12/08/2021   Procedure: ESOPHAGOGASTRODUODENOSCOPY (EGD);  Surgeon: Annamaria Helling, DO;  Location: [facility] ENDOSCOPY;  Service: Gastroenterology;  Laterality: N/A;   ESOPHAGOGASTRODUODENOSCOPY (EGD) WITH PROPOFOL N/A 08/21/2017   Procedure: ESOPHAGOGASTRODUODENOSCOPY (EGD) WITH PROPOFOL;  Surgeon: Manya Silvas, MD;  Location: [facility] ENDOSCOPY;  Service: Endoscopy;  Laterality: N/A;   VEIN SURGERY  2010    Family History  Problem Relation Age of Onset   Heart disease Mother    Heart disease Father    Cancer Sister        bone ca.    Breast cancer Sister    Stroke Sister 49       from oral contraceptive   Lung cancer Sister    Lung cancer Brother    Hematuria Neg Hx    Kidney cancer Neg Hx  Kidney disease Neg Hx    Prostate cancer Neg Hx    Sickle cell trait Neg Hx    Tuberculosis Neg Hx     Social History   Socioeconomic History   Marital status: Widowed    Spouse name: Not on file   Number of children: 3   Years of education: Not on file   Highest education level: Not on file  Occupational History   Occupation: Housekeeping    Employer: [employer]    Comment: Retired  Tobacco Use   Smoking status: Never   Smokeless tobacco: Never  Vaping Use   Vaping Use: Never used  Substance and Sexual Activity   Alcohol use: No   Drug use: No   Sexual activity: Never  Other Topics  Concern   Not on file  Social History Narrative   Widowed 11/18   2 sons and a daughter      Has living will   Son Marden Noble (and other kids) should be health care POA   Would accept resuscitation but no prolonged machines or tube feeds   Social Determinants of Health   Financial Resource Strain: Not on file  Food Insecurity: Not on file  Transportation Needs: Not on file  Physical Activity: Not on file  Stress: Not on file  Social Connections: Not on file  Intimate Partner Violence: Not on file   Review of Systems Not a great sleeper--often up at [facility] and trouble getting back to sleep (since caring for husband years ago) Appetite is stronger---relates to the metoprolol Has gained weight --but not in the past few years Does some exercise---walking Bowels are slow--uses OTC med prn      Objective:   Physical Exam Constitutional:      Appearance: Normal appearance.  Cardiovascular:     Rate and Rhythm: Normal rate and regular rhythm.     Pulses: Normal pulses.     Heart sounds: No murmur heard.    No gallop.  Pulmonary:     Effort: Pulmonary effort is normal.     Breath sounds: Normal breath sounds. No wheezing or rales.  Musculoskeletal:     Cervical back: Neck supple.     Comments: Slight left ankle edema  Lymphadenopathy:     Cervical: No cervical adenopathy.  Skin:    Findings: No rash.  Neurological:     Mental Status: She is alert.  Psychiatric:        Mood and Affect: Mood normal.        Behavior: Behavior normal.            Assessment & Plan:

## 2022-04-02 ENCOUNTER — Ambulatory Visit (INDEPENDENT_AMBULATORY_CARE_PROVIDER_SITE_OTHER): Payer: PPO | Admitting: Internal Medicine

## 2022-04-02 ENCOUNTER — Encounter: Payer: Self-pay | Admitting: Internal Medicine

## 2022-04-02 VITALS — BP 130/82 | HR 82 | Temp 97.3°F | Ht 62.0 in | Wt 152.0 lb

## 2022-04-02 DIAGNOSIS — I10 Essential (primary) hypertension: Secondary | ICD-10-CM

## 2022-04-02 DIAGNOSIS — K219 Gastro-esophageal reflux disease without esophagitis: Secondary | ICD-10-CM

## 2022-04-02 LAB — RENAL FUNCTION PANEL
Albumin: 4.3 g/dL (ref 3.5–5.2)
BUN: 12 mg/dL (ref 6–23)
CO2: 31 mEq/L (ref 19–32)
Calcium: 9.4 mg/dL (ref 8.4–10.5)
Chloride: 98 mEq/L (ref 96–112)
Creatinine, Ser: 0.63 mg/dL (ref 0.40–1.20)
GFR: 84.21 mL/min (ref >60.00)
Glucose, Bld: 102 mg/dL — ABNORMAL HIGH (ref 70–99)
Phosphorus: 3.9 mg/dL (ref 2.3–4.6)
Potassium: 4.2 mEq/L (ref 3.5–5.1)
Sodium: 138 mEq/L (ref 135–145)

## 2022-04-02 NOTE — Assessment & Plan Note (Signed)
BP Readings from Last 3 Encounters:  04/02/22 130/82  02/26/22 134/86  12/08/21 97/79   Doing better off metoprolol Amlodipine 5/losartan 50 daily Check renal

## 2022-04-02 NOTE — Assessment & Plan Note (Signed)
Better with daily omeprazole 20

## 2022-04-02 NOTE — Progress Notes (Signed)
Subjective:    Patient ID: Katherine Gallegos, female    DOB: 09/12/1940, 82 y.o.   MRN: [id number]  HPI Here for follow up of reflux and HTN  Feels better May be related to changes in BP meds Has checked BP several times weekly---usually 145/80 typically  No chest pain or SOB Some edema---but wears the support hose  Is on the omeprazole daily At bedtime Doesn't eat close to bedtime Limiting coffee No dysphagia  Current Outpatient Medications on File Prior to Visit  Medication Sig Dispense Refill   acetaminophen (TYLENOL) 325 MG tablet Take 650 mg by mouth every 6 (six) hours as needed.     amLODipine (NORVASC) 5 MG tablet Take 5 mg by mouth daily.     losartan (COZAAR) 50 MG tablet Take 1 tablet (50 mg total) by mouth daily. 90 tablet 3   omeprazole (PRILOSEC) 20 MG capsule Take 1 capsule (20 mg total) by mouth daily. 90 capsule 3   No current facility-administered medications on file prior to visit.    Allergies  Allergen Reactions   Hydrochlorothiazide Rash   Latex Rash   Lovastatin Other (See Comments)    Muscle pain    Past Medical History:  Diagnosis Date   Allergic state    Arthritis    FH: colon polyps    GERD (gastroesophageal reflux disease)    Hypertension    Skin cancer     Past Surgical History:  Procedure Laterality Date   ABDOMINAL HYSTERECTOMY  1977   Partial   APPENDECTOMY  1977   BREAST BIOPSY Bilateral 20 + yrs    excisional bx. negative results   BREAST BIOPSY Left 04/02/2016   benign-FIBROCYSTIC CHANGE   COLON SURGERY  2014   Large Polypectomy with Bowel Resection (Dr. Rochel Brome)   COLONOSCOPY N/A 12/08/2021   Procedure: COLONOSCOPY;  Surgeon: Annamaria Helling, DO;  Location: [place];  Service: Gastroenterology;  Laterality: N/A;   COLONOSCOPY WITH ESOPHAGOGASTRODUODENOSCOPY (EGD)     COLONOSCOPY WITH PROPOFOL N/A 06/13/2016   Procedure: COLONOSCOPY WITH PROPOFOL; adenomatous polyps Surgeon: Manya Silvas, MD;   Location: [facility] ENDOSCOPY;  Service: Endoscopy;  Laterality: N/A;   ESOPHAGOGASTRODUODENOSCOPY N/A 12/08/2021   Procedure: ESOPHAGOGASTRODUODENOSCOPY (EGD);  Surgeon: Annamaria Helling, DO;  Location: [facility] ENDOSCOPY;  Service: Gastroenterology;  Laterality: N/A;   ESOPHAGOGASTRODUODENOSCOPY (EGD) WITH PROPOFOL N/A 08/21/2017   Procedure: ESOPHAGOGASTRODUODENOSCOPY (EGD) WITH PROPOFOL;  Surgeon: Manya Silvas, MD;  Location: [facility] ENDOSCOPY;  Service: Endoscopy;  Laterality: N/A;   VEIN SURGERY  2010    Family History  Problem Relation Age of Onset   Heart disease Mother    Heart disease Father    Cancer Sister        bone ca.    Breast cancer Sister    Stroke Sister 91       from oral contraceptive   Lung cancer Sister    Lung cancer Brother    Hematuria Neg Hx    Kidney cancer Neg Hx    Kidney disease Neg Hx    Prostate cancer Neg Hx    Sickle cell trait Neg Hx    Tuberculosis Neg Hx     Social History   Socioeconomic History   Marital status: Widowed    Spouse name: Not on file   Number of children: 3   Years of education: Not on file   Highest education level: Not on file  Occupational History   Occupation: Housekeeping  Employer: [employer]    Comment: Retired  Tobacco Use   Smoking status: Never   Smokeless tobacco: Never  Vaping Use   Vaping Use: Never used  Substance and Sexual Activity   Alcohol use: No   Drug use: No   Sexual activity: Never  Other Topics Concern   Not on file  Social History Narrative   Widowed 11/18   2 sons and a daughter      Has living will   Son Marden Noble (and other kids) should be health care POA   Would accept resuscitation but no prolonged machines or tube feeds   Social Determinants of Health   Financial Resource Strain: Not on file  Food Insecurity: Not on file  Transportation Needs: Not on file  Physical Activity: Not on file  Stress: Not on file  Social Connections: Not on file  Intimate Partner  Violence: Not on file   Review of Systems Appetite is okay--better with the med changes Sleeps okay---back to sleeping all night again     Objective:   Physical Exam Constitutional:      Appearance: Normal appearance.  Cardiovascular:     Rate and Rhythm: Normal rate and regular rhythm.     Heart sounds: No murmur heard.    No gallop.  Pulmonary:     Effort: Pulmonary effort is normal.     Breath sounds: Normal breath sounds. No wheezing or rales.  Musculoskeletal:     Cervical back: Neck supple.     Right lower leg: No edema.     Left lower leg: No edema.  Lymphadenopathy:     Cervical: No cervical adenopathy.  Neurological:     Mental Status: She is alert.            Assessment & Plan:

## 2022-05-11 ENCOUNTER — Telehealth: Payer: Self-pay | Admitting: Internal Medicine

## 2022-05-11 NOTE — Telephone Encounter (Signed)
Patient stopped and wanted to let Dr. Silvio Pate know her blood pressure readings. Her readings have been: 156/90, 153/86, 130/83, and 159/82. She stated she usually takes them between 1:30 PM- 2:00 PM daily. Thank you!

## 2022-05-11 NOTE — Telephone Encounter (Signed)
Prescription Request  05/11/2022  Is this a "Controlled Substance" medicine? No  LOV: 04/02/2022  What is the name of the medication or equipment? amLODipine (NORVASC) 5 MG tablet   Have you contacted your pharmacy to request a refill? Yes   Which pharmacy would you like this sent to?  [place], [place] - [name] [address] Phone: [phone] Fax: [phone]    Patient notified that their request is being sent to the clinical staff for review and that they should receive a response within 2 business days.   Please advise at Mobile [phone] (mobile)

## 2022-05-14 ENCOUNTER — Other Ambulatory Visit: Payer: Self-pay

## 2022-05-14 MED ORDER — AMLODIPINE BESYLATE 5 MG PO TABS: 5.0000 mg | ORAL_TABLET | Freq: Every day | ORAL | 3 refills | Status: DC

## 2022-05-14 MED ORDER — LOSARTAN POTASSIUM 50 MG PO TABS: 100.0000 mg | ORAL_TABLET | Freq: Every day | ORAL | 3 refills | Status: DC

## 2022-05-14 NOTE — Telephone Encounter (Signed)
Called and spoke to patient, she will increase her dose of losartan to 118m. Patient would like to know if she needs to continue the amlodipine 569m as well. If so, okay to send in refill?

## 2022-05-14 NOTE — Telephone Encounter (Signed)
Noted, sent in Rxs and updated patient.

## 2022-05-14 NOTE — Telephone Encounter (Signed)
Called and updated patient and sent Rx to pharmacy per Dr. Silvio Pate. See other phone note.

## 2022-10-03 ENCOUNTER — Encounter: Payer: Self-pay | Admitting: Internal Medicine

## 2022-10-03 ENCOUNTER — Ambulatory Visit (INDEPENDENT_AMBULATORY_CARE_PROVIDER_SITE_OTHER): Payer: PPO | Admitting: Internal Medicine

## 2022-10-03 VITALS — BP 124/80 | HR 84 | Temp 97.8°F | Ht 63.0 in | Wt 141.0 lb

## 2022-10-03 DIAGNOSIS — I872 Venous insufficiency (chronic) (peripheral): Secondary | ICD-10-CM | POA: Diagnosis not present

## 2022-10-03 DIAGNOSIS — I10 Essential (primary) hypertension: Secondary | ICD-10-CM | POA: Diagnosis not present

## 2022-10-03 DIAGNOSIS — Z0000 Encounter for general adult medical examination without abnormal findings: Secondary | ICD-10-CM | POA: Diagnosis not present

## 2022-10-03 DIAGNOSIS — K2100 Gastro-esophageal reflux disease with esophagitis, without bleeding: Secondary | ICD-10-CM

## 2022-10-03 LAB — COMPREHENSIVE METABOLIC PANEL
ALT: 10 U/L (ref 0–35)
AST: 15 U/L (ref 0–37)
Albumin: 4.4 g/dL (ref 3.5–5.2)
Alkaline Phosphatase: 76 U/L (ref 39–117)
BUN: 9 mg/dL (ref 6–23)
CO2: 31 mEq/L (ref 19–32)
Calcium: 9.7 mg/dL (ref 8.4–10.5)
Chloride: 99 mEq/L (ref 96–112)
Creatinine, Ser: 0.66 mg/dL (ref 0.40–1.20)
GFR: 82.97 mL/min (ref >60.00)
Glucose, Bld: 111 mg/dL — ABNORMAL HIGH (ref 70–99)
Potassium: 4.7 mEq/L (ref 3.5–5.1)
Sodium: 139 mEq/L (ref 135–145)
Total Bilirubin: 0.8 mg/dL (ref 0.2–1.2)
Total Protein: 7.2 g/dL (ref 6.0–8.3)

## 2022-10-03 LAB — LIPID PANEL
Cholesterol: 228 mg/dL — ABNORMAL HIGH (ref 0–200)
HDL: 84.1 mg/dL (ref >39.00)
LDL Cholesterol: 127 mg/dL — ABNORMAL HIGH (ref 0–99)
NonHDL: 144.16
Total CHOL/HDL Ratio: 3
Triglycerides: 87 mg/dL (ref 0.0–149.0)
VLDL: 17.4 mg/dL (ref 0.0–40.0)

## 2022-10-03 LAB — CBC
HCT: 38 % (ref 36.0–46.0)
Hemoglobin: 12.6 g/dL (ref 12.0–15.0)
MCHC: 33.3 g/dL (ref 30.0–36.0)
MCV: 88.4 fl (ref 78.0–100.0)
Platelets: 227 10*3/uL (ref 150.0–400.0)
RBC: 4.29 Mil/uL (ref 3.87–5.11)
RDW: 13.5 % (ref 11.5–15.5)
WBC: 7.8 10*3/uL (ref 4.0–10.5)

## 2022-10-03 LAB — VITAMIN B12: Vitamin B-12: 120 pg/mL — ABNORMAL LOW (ref 211–911)

## 2022-10-03 LAB — TSH: TSH: 1.03 u[IU]/mL (ref 0.35–5.50)

## 2022-10-03 MED ORDER — LOSARTAN POTASSIUM 100 MG PO TABS: 100.0000 mg | ORAL_TABLET | Freq: Every day | ORAL | 3 refills | Status: DC

## 2022-10-03 NOTE — Assessment & Plan Note (Signed)
I have personally reviewed the Medicare Annual Wellness questionnaire and have noted 1. The patient's medical and social history 2. Their use of alcohol, tobacco or illicit drugs 3. Their current medications and supplements 4. The patient's functional ability including ADL's, fall risks, home safety risks and hearing or visual             impairment. 5. Diet and physical activities 6. Evidence for depression or mood disorders  The patients weight, height, BMI and visual acuity have been recorded in the chart I have made referrals, counseling and provided education to the patient based review of the above and I have provided the pt with a written personalized care plan for preventive services.  I have provided you with a copy of your personalized plan for preventive services. Please take the time to review along with your updated medication list.  Had last colon last year Consider one last mammogram this year No pap due to age Discussed resistance exercise Prefers no COVID vaccines Flu, RSV in the fall--due for Td as well

## 2022-10-03 NOTE — Patient Instructions (Signed)
Please get a tetanus booster at the pharmacy. Get a flu vaccine and the RSV vaccine in the fall (at the pharmacy also)

## 2022-10-03 NOTE — Assessment & Plan Note (Signed)
Still some hoarseness but better No reflux now--can try reducing to every other day omeprazole

## 2022-10-03 NOTE — Assessment & Plan Note (Signed)
BP Readings from Last 3 Encounters:  10/03/22 124/80  04/02/22 130/82  02/26/22 134/86   Doing well with the amlodipine 5mg  daily and losartan 100mg  daily Will recheck labs

## 2022-10-03 NOTE — Assessment & Plan Note (Signed)
Better on lower amlodipine dose Elevation and support hose

## 2022-10-03 NOTE — Progress Notes (Signed)
Hearing Screening - Comments:: Passed whisper test Vision Screening - Comments:: Late 2023

## 2022-10-03 NOTE — Progress Notes (Signed)
Subjective:    Patient ID: Katherine Gallegos, female    DOB: 1940/08/30, 82 y.o.   MRN: [id number]  HPI Here for Medicare wellness visit and follow up of chronic health conditions Reviewed advanced directives Reviewed other doctors---Dr Russo--GI, Dr Dew--vascular, Dr Floy Sabina, [facility], Dr Manilla Sink No hospitalizations or surgery in the past year Vision is fine Hearing is generally okay--sometimes gets cerumen No alcohol or tobacco Did fall once--tripped on hose. No sig injury No regular depression or anhedonia Has been exercising more--walking and visiting folks Independent with instrumental ADLs No sig memory issues  Still grieves for husband after 6 years--but does okay Really helped by time with 25 year old granddaughter Doing better now  Checks BP occasionally---twice a week usually Highest in the 140's systolic Splits the BP meds dosage Some edema--better with elevation and support hose No chest pain or SOB No dizziness or syncope No palpitations  Still some hoarseness Heartburn is gone on the omeprazole--and no dysphagia Tried off the med---but went back on  Usually not as hungry for supper Weight down about 10# in the past year  Current Outpatient Medications on File Prior to Visit  Medication Sig Dispense Refill   acetaminophen (TYLENOL) 325 MG tablet Take 650 mg by mouth every 6 (six) hours as needed.     amLODipine (NORVASC) 5 MG tablet Take 1 tablet (5 mg total) by mouth daily. 90 tablet 3   losartan (COZAAR) 50 MG tablet Take 2 tablets (100 mg total) by mouth daily. 180 tablet 3   omeprazole (PRILOSEC) 20 MG capsule Take 1 capsule (20 mg total) by mouth daily. 90 capsule 3   No current facility-administered medications on file prior to visit.    Allergies  Allergen Reactions   Hydrochlorothiazide Rash   Latex Rash   Lovastatin Other (See Comments)    Muscle pain    Past Medical History:  Diagnosis Date   Allergic state    Arthritis     FH: colon polyps    GERD (gastroesophageal reflux disease)    Hypertension    Skin cancer     Past Surgical History:  Procedure Laterality Date   ABDOMINAL HYSTERECTOMY  1977   Partial   APPENDECTOMY  1977   BREAST BIOPSY Bilateral 20 + yrs    excisional bx. negative results   BREAST BIOPSY Left 04/02/2016   benign-FIBROCYSTIC CHANGE   COLON SURGERY  2014   Large Polypectomy with Bowel Resection (Dr. Renda Rolls)   COLONOSCOPY N/A 12/08/2021   Procedure: COLONOSCOPY;  Surgeon: Jaynie Collins, DO;  Location: [facility] ENDOSCOPY;  Service: Gastroenterology;  Laterality: N/A;   COLONOSCOPY WITH ESOPHAGOGASTRODUODENOSCOPY (EGD)     COLONOSCOPY WITH PROPOFOL N/A 06/13/2016   Procedure: COLONOSCOPY WITH PROPOFOL; adenomatous polyps Surgeon: Scot Jun, MD;  Location: [facility] ENDOSCOPY;  Service: Endoscopy;  Laterality: N/A;   ESOPHAGOGASTRODUODENOSCOPY N/A 12/08/2021   Procedure: ESOPHAGOGASTRODUODENOSCOPY (EGD);  Surgeon: Jaynie Collins, DO;  Location: [facility] ENDOSCOPY;  Service: Gastroenterology;  Laterality: N/A;   ESOPHAGOGASTRODUODENOSCOPY (EGD) WITH PROPOFOL N/A 08/21/2017   Procedure: ESOPHAGOGASTRODUODENOSCOPY (EGD) WITH PROPOFOL;  Surgeon: Scot Jun, MD;  Location: [facility] ENDOSCOPY;  Service: Endoscopy;  Laterality: N/A;   VEIN SURGERY  2010    Family History  Problem Relation Age of Onset   Heart disease Mother    Heart disease Father    Cancer Sister        bone ca.    Breast cancer Sister    Stroke Sister 84  from oral contraceptive   Lung cancer Sister    Lung cancer Brother    Hematuria Neg Hx    Kidney cancer Neg Hx    Kidney disease Neg Hx    Prostate cancer Neg Hx    Sickle cell trait Neg Hx    Tuberculosis Neg Hx     Social History   Socioeconomic History   Marital status: Widowed    Spouse name: Not on file   Number of children: 3   Years of education: Not on file   Highest education level: Not on file  Occupational History    Occupation: Housekeeping    Employer: [employer]    Comment: Retired  Tobacco Use   Smoking status: Never    Passive exposure: Past   Smokeless tobacco: Never  Vaping Use   Vaping Use: Never used  Substance and Sexual Activity   Alcohol use: No   Drug use: No   Sexual activity: Never  Other Topics Concern   Not on file  Social History Narrative   Widowed 11/18   2 sons and a daughter      Has living will   Son Gala Romney (and other kids) should be health care POA   Would accept resuscitation but no prolonged machines or tube feeds   Social Determinants of Health   Financial Resource Strain: Not on file  Food Insecurity: Not on file  Transportation Needs: Not on file  Physical Activity: Not on file  Stress: Not on file  Social Connections: Not on file  Intimate Partner Violence: Not on file   Review of Systems Sleeps well Wears seat belt Teeth are fine--keeps up with dentist No suspicious skin lesions--does get yearly derm visit Bowels move fine--no blood Gets mild back or joint pains--better sleeping with pillow between knees No trouble voiding    Objective:   Physical Exam Constitutional:      Appearance: Normal appearance.  HENT:     Mouth/Throat:     Pharynx: No oropharyngeal exudate or posterior oropharyngeal erythema.  Eyes:     Conjunctiva/sclera: Conjunctivae normal.     Pupils: Pupils are equal, round, and reactive to light.  Cardiovascular:     Rate and Rhythm: Normal rate and regular rhythm.     Pulses: Normal pulses.     Heart sounds: No murmur heard.    No gallop.  Pulmonary:     Effort: Pulmonary effort is normal.     Breath sounds: Normal breath sounds. No wheezing or rales.  Abdominal:     Palpations: Abdomen is soft.     Tenderness: There is no abdominal tenderness.  Musculoskeletal:     Cervical back: Neck supple.     Right lower leg: No edema.     Left lower leg: No edema.  Lymphadenopathy:     Cervical: No cervical adenopathy.   Skin:    Findings: No rash.  Neurological:     General: No focal deficit present.     Mental Status: She is alert and oriented to person, place, and time.     Comments: Word naming-- 1 then froze  Recall 1/3  Psychiatric:        Mood and Affect: Mood normal.        Behavior: Behavior normal.            Assessment & Plan:

## 2022-10-04 ENCOUNTER — Other Ambulatory Visit: Payer: Self-pay | Admitting: Internal Medicine

## 2022-10-04 DIAGNOSIS — E538 Deficiency of other specified B group vitamins: Secondary | ICD-10-CM

## 2022-10-16 ENCOUNTER — Encounter: Payer: Self-pay | Admitting: Dermatology

## 2022-10-16 ENCOUNTER — Ambulatory Visit: Payer: PPO | Admitting: Dermatology

## 2022-10-16 VITALS — BP 154/89 | HR 89

## 2022-10-16 DIAGNOSIS — L820 Inflamed seborrheic keratosis: Secondary | ICD-10-CM

## 2022-10-16 DIAGNOSIS — Z85828 Personal history of other malignant neoplasm of skin: Secondary | ICD-10-CM | POA: Diagnosis not present

## 2022-10-16 DIAGNOSIS — W908XXA Exposure to other nonionizing radiation, initial encounter: Secondary | ICD-10-CM | POA: Diagnosis not present

## 2022-10-16 DIAGNOSIS — L720 Epidermal cyst: Secondary | ICD-10-CM

## 2022-10-16 DIAGNOSIS — L738 Other specified follicular disorders: Secondary | ICD-10-CM | POA: Diagnosis not present

## 2022-10-16 DIAGNOSIS — L719 Rosacea, unspecified: Secondary | ICD-10-CM

## 2022-10-16 DIAGNOSIS — D2272 Melanocytic nevi of left lower limb, including hip: Secondary | ICD-10-CM

## 2022-10-16 DIAGNOSIS — D2271 Melanocytic nevi of right lower limb, including hip: Secondary | ICD-10-CM | POA: Diagnosis not present

## 2022-10-16 DIAGNOSIS — L814 Other melanin hyperpigmentation: Secondary | ICD-10-CM | POA: Diagnosis not present

## 2022-10-16 DIAGNOSIS — L821 Other seborrheic keratosis: Secondary | ICD-10-CM | POA: Diagnosis not present

## 2022-10-16 DIAGNOSIS — D2239 Melanocytic nevi of other parts of face: Secondary | ICD-10-CM | POA: Diagnosis not present

## 2022-10-16 DIAGNOSIS — L578 Other skin changes due to chronic exposure to nonionizing radiation: Secondary | ICD-10-CM

## 2022-10-16 DIAGNOSIS — Z1283 Encounter for screening for malignant neoplasm of skin: Secondary | ICD-10-CM | POA: Diagnosis not present

## 2022-10-16 DIAGNOSIS — D1801 Hemangioma of skin and subcutaneous tissue: Secondary | ICD-10-CM

## 2022-10-16 DIAGNOSIS — D229 Melanocytic nevi, unspecified: Secondary | ICD-10-CM

## 2022-10-16 MED ORDER — METRONIDAZOLE 0.75 % EX CREA: TOPICAL_CREAM | CUTANEOUS | 6 refills | Status: DC

## 2022-10-16 NOTE — Progress Notes (Signed)
Follow-Up Visit   Subjective  Katherine Gallegos is a 82 y.o. female who presents for the following: Skin Cancer Screening and Full Body Skin Exam  The patient presents for Total-Body Skin Exam (TBSE) for skin cancer screening and mole check. The patient has spots, moles and lesions to be evaluated, some may be new or changing and the patient may have concern these could be cancer.  She has a few spots that get itchy and irritated, and that she picks at.    The following portions of the chart were reviewed this encounter and updated as appropriate: medications, allergies, medical history  Review of Systems:  No other skin or systemic complaints except as noted in HPI or Assessment and Plan.  Objective  Well appearing patient in no apparent distress; mood and affect are within normal limits.  A full examination was performed including scalp, head, eyes, ears, nose, lips, neck, chest, axillae, abdomen, back, buttocks, bilateral upper extremities, bilateral lower extremities, hands, feet, fingers, toes, fingernails, and toenails. All findings within normal limits unless otherwise noted below.   Relevant physical exam findings are noted in the Assessment and Plan.  nasal dorsum 1 mm firm flesh papule  R forearm x 2, R upper arm x 2, R hand dorsum x 1, R jaw x 1, L temple x 1 (7) Erythematous stuck-on, waxy papule    Assessment & Plan   SKIN CANCER SCREENING PERFORMED TODAY.  ACTINIC DAMAGE - Chronic condition, secondary to cumulative UV/sun exposure - diffuse scaly erythematous macules with underlying dyspigmentation - Recommend daily broad spectrum sunscreen SPF 30+ to sun-exposed areas, reapply every 2 hours as needed.  - Staying in the shade or wearing long sleeves, sun glasses (UVA+UVB protection) and wide brim hats (4-inch brim around the entire circumference of the hat) are also recommended for sun protection.  - Call for new or changing lesions.  LENTIGINES, SEBORRHEIC  KERATOSES, HEMANGIOMAS - Benign normal skin lesions - Benign-appearing - Call for any changes  MELANOCYTIC NEVI - Tan-brown and/or pink-flesh-colored symmetric macules and papules - Left anterior thigh, 5 mm thin, brown papule - Right plantar foot, 2 mm brown macule - Benign appearing on exam today - Observation - Call clinic for new or changing moles - Recommend daily use of broad spectrum spf 30+ sunscreen to sun-exposed areas.   HISTORY OF BASAL CELL CARCINOMA OF THE SKIN Patient reports history of bcc at left upper back area  - No evidence of recurrence today - Recommend regular full body skin exams - Recommend daily broad spectrum sunscreen SPF 30+ to sun-exposed areas, reapply every 2 hours as needed.  - Call if any new or changing lesions are noted between office visits  Sebaceous Hyperplasia - Pink yellow papule, right lower cheek 4 mm - Benign-appearing - Observe. Call for changes.  Milia - tiny firm white papules face - type of cyst - benign - may be extracted if symptomatic - observe  ROSACEA Exam Mid face and forehead erythema with telangiectasias   Chronic and persistent condition with duration or expected duration over one year. Condition is symptomatic/ bothersome to patient. Not currently at goal.   Rosacea is a chronic progressive skin condition usually affecting the face of adults, causing redness and/or acne bumps. It is treatable but not curable. It sometimes affects the eyes (ocular rosacea) as well. It may respond to topical and/or systemic medication and can flare with stress, sun exposure, alcohol, exercise, topical steroids (including hydrocortisone/cortisone 10) and some foods.  Daily application of broad spectrum spf 30+ sunscreen to face is recommended to reduce flares.  Patient denies grittiness of the eyes  Treatment Plan Start metronidazole 0.75% cream Apply to face at bedtime, twice daily with flares dsp 45g 6Rf.  Fibrous papule of  nose nasal dorsum  Benign-appearing.  Observation.      Inflamed seborrheic keratosis (7) R forearm x 2, R upper arm x 2, R hand dorsum x 1, R jaw x 1, L temple x 1  Symptomatic, irritating, patient would like treated.  Destruction of lesion - R forearm x 2, R upper arm x 2, R hand dorsum x 1, R jaw x 1, L temple x 1 (7)  Destruction method: cryotherapy   Informed consent: discussed and consent obtained   Lesion destroyed using liquid nitrogen: Yes   Region frozen until ice ball extended beyond lesion: Yes   Outcome: patient tolerated procedure well with no complications   Post-procedure details: wound care instructions given   Additional details:  Prior to procedure, discussed risks of blister formation, small wound, skin dyspigmentation, or rare scar following cryotherapy. Recommend Vaseline ointment to treated areas while healing.    Return in about 1 year (around 10/16/2023) for TBSE, Hx BCC.  ICherlyn Labella, CMA, am acting as scribe for Willeen Niece, MD .   Documentation: I have reviewed the above documentation for accuracy and completeness, and I agree with the above.  Willeen Niece, MD

## 2022-10-16 NOTE — Patient Instructions (Addendum)
Cryotherapy Aftercare  Wash gently with soap and water everyday.   Apply Vaseline and Band-Aid daily until healed.    Seborrheic Keratosis  What causes seborrheic keratoses? Seborrheic keratoses are harmless, common skin growths that first appear during adult life.  As time goes by, more growths appear.  Some people may develop a large number of them.  Seborrheic keratoses appear on both covered and uncovered body parts.  They are not caused by sunlight.  The tendency to develop seborrheic keratoses can be inherited.  They vary in color from skin-colored to gray, brown, or even black.  They can be either smooth or have a rough, warty surface.   Seborrheic keratoses are superficial and look as if they were stuck on the skin.  Under the microscope this type of keratosis looks like layers upon layers of skin.  That is why at times the top layer may seem to fall off, but the rest of the growth remains and re-grows.    Treatment Seborrheic keratoses do not need to be treated, but can easily be removed in the office.  Seborrheic keratoses often cause symptoms when they rub on clothing or jewelry.  Lesions can be in the way of shaving.  If they become inflamed, they can cause itching, soreness, or burning.  Removal of a seborrheic keratosis can be accomplished by freezing, burning, or surgery. If any spot bleeds, scabs, or grows rapidly, please return to have it checked, as these can be an indication of a skin cancer.   Rosacea  What is rosacea? Rosacea (say: ro-zay-sha) is a common skin disease that usually begins as a trend of flushing or blushing easily.  As rosacea progresses, a persistent redness in the center of the face will develop and may gradually spread beyond the nose and cheeks to the forehead and chin.  In some cases, the ears, chest, and back could be affected.  Rosacea may appear as tiny blood vessels or small red bumps that occur in crops.  Frequently they can contain pus, and are  called "pustules".  If the bumps do not contain pus, they are referred to as "papules".  Rarely, in prolonged, untreated cases of rosacea, the oil glands of the nose and cheeks may become permanently enlarged.  This is called rhinophyma, and is seen more frequently in men.  Signs and Risks In its beginning stages, rosacea tends to come and go, which makes it difficult to recognize.  It can start as intermittent flushing of the face.  Eventually, blood vessels may become permanently visible.  Pustules and papules can appear, but can be mistaken for adult acne.  People of all races, ages, genders and ethnic groups are at risk of developing rosacea.  However, it is more common in women (especially around menopause) and adults with fair skin between the ages of 30 and 50.  Treatment Dermatologists typically recommend a combination of treatments to effectively manage rosacea.  Treatment can improve symptoms and may stop the progression of the rosacea.  Treatment may involve both topical and oral medications.  The tetracycline antibiotics are often used for their anti-inflammatory effect; however, because of the possibility of developing antibiotic resistance, they should not be used long term at full dose.  For dilated blood vessels the options include electrodessication (uses electric current through a small needle), laser treatment, and cosmetics to hide the redness.   With all forms of treatment, improvement is a slow process, and patients may not see any results for the first   3-4 weeks.  It is very important to avoid the sun and other triggers.  Patients must wear sunscreen daily.  Skin Care Instructions: Cleanse the skin with a mild soap such as CeraVe cleanser, Cetaphil cleanser, or Dove soap once or twice daily as needed. Moisturize with Eucerin Redness Relief Daily Perfecting Lotion (has a subtle green tint), CeraVe Moisturizing Cream, or Oil of Olay Daily Moisturizer with sunscreen every morning  and/or night as recommended. Makeup should be "non-comedogenic" (won't clog pores) and be labeled "for sensitive skin". Good choices for cosmetics are: Neutrogena, Almay, and Physician's Formula.  Any product with a green tint tends to offset a red complexion. If your eyes are dry and irritated, use artificial tears 2-3 times per day and cleanse the eyelids daily with baby shampoo.  Have your eyes examined at least every 2 years.  Be sure to tell your eye doctor that you have rosacea. Alcoholic beverages tend to cause flushing of the skin, and may make rosacea worse. Always wear sunscreen, protect your skin from extreme hot and cold temperatures, and avoid spicy foods, hot drinks, and mechanical irritation such as rubbing, scrubbing, or massaging the face.  Avoid harsh skin cleansers, cleansing masks, astringents, and exfoliation. If a particular product burns or makes your face feel tight, then it is likely to flare your rosacea. If you are having difficulty finding a sunscreen that you can tolerate, you may try switching to a chemical-free sunscreen.  These are ones whose active ingredient is zinc oxide or titanium dioxide only.  They should also be fragrance free, non-comedogenic, and labeled for sensitive skin. Rosacea triggers may vary from person to person.  There are a variety of foods that have been reported to trigger rosacea.  Some patients find that keeping a diary of what they were doing when they flared helps them avoid triggers.   Due to recent changes in healthcare laws, you may see results of your pathology and/or laboratory studies on MyChart before the doctors have had a chance to review them. We understand that in some cases there may be results that are confusing or concerning to you. Please understand that not all results are received at the same time and often the doctors may need to interpret multiple results in order to provide you with the best plan of care or course of treatment.  Therefore, we ask that you please give us 2 business days to thoroughly review all your results before contacting the office for clarification. Should we see a critical lab result, you will be contacted sooner.   If You Need Anything After Your Visit  If you have any questions or concerns for your doctor, please call our main line at [phone] and press option 4 to reach your doctor's medical assistant. If no one answers, please leave a voicemail as directed and we will return your call as soon as possible. Messages left after 4 pm will be answered the following business day.   You may also send us a message via MyChart. We typically respond to MyChart messages within 1-2 business days.  For prescription refills, please ask your pharmacy to contact our office. Our fax number is [phone].  If you have an urgent issue when the clinic is closed that cannot wait until the next business day, you can page your doctor at the number below.    Please note that while we do our best to be available for urgent issues outside of office hours, we are not available   24/7.   If you have an urgent issue and are unable to reach us, you may choose to seek medical care at your doctor's office, retail clinic, urgent care center, or emergency room.  If you have a medical emergency, please immediately call 911 or go to the emergency department.  Pager Numbers  - Dr. Kowalski: [phone]  - Dr. Moye: [phone]  - Dr. Stewart: [phone]  In the event of inclement weather, please call our main line at [phone] for an update on the status of any delays or closures.  Dermatology Medication Tips: Please keep the boxes that topical medications come in in order to help keep track of the instructions about where and how to use these. Pharmacies typically print the medication instructions only on the boxes and not directly on the medication tubes.   If your medication is too expensive, please  contact our office at [phone] option 4 or send us a message through MyChart.   We are unable to tell what your co-pay for medications will be in advance as this is different depending on your insurance coverage. However, we may be able to find a substitute medication at lower cost or fill out paperwork to get insurance to cover a needed medication.   If a prior authorization is required to get your medication covered by your insurance company, please allow us 1-2 business days to complete this process.  Drug prices often vary depending on where the prescription is filled and some pharmacies may offer cheaper prices.  The website www.goodrx.com contains coupons for medications through different pharmacies. The prices here do not account for what the cost may be with help from insurance (it may be cheaper with your insurance), but the website can give you the price if you did not use any insurance.  - You can print the associated coupon and take it with your prescription to the pharmacy.  - You may also stop by our office during regular business hours and pick up a GoodRx coupon card.  - If you need your prescription sent electronically to a different pharmacy, notify our office through [facility] MyChart or by phone at [phone] option 4.     Si Usted Necesita Algo Despus de Su Visita  Tambin puede enviarnos un mensaje a travs de MyChart. Por lo general respondemos a los mensajes de MyChart en el transcurso de 1 a 2 das hbiles.  Para renovar recetas, por favor pida a su farmacia que se ponga en contacto con nuestra oficina. Nuestro nmero de fax es el [phone].  Si tiene un asunto urgente cuando la clnica est cerrada y que no puede esperar hasta el siguiente da hbil, puede llamar/localizar a su doctor(a) al nmero que aparece a continuacin.   Por favor, tenga en cuenta que aunque hacemos todo lo posible para estar disponibles para asuntos urgentes fuera del horario de  oficina, no estamos disponibles las 24 horas del da, los 7 das de la semana.   Si tiene un problema urgente y no puede comunicarse con nosotros, puede optar por buscar atencin mdica  en el consultorio de su doctor(a), en una clnica privada, en un centro de atencin urgente o en una sala de emergencias.  Si tiene una emergencia mdica, por favor llame inmediatamente al 911 o vaya a la sala de emergencias.  Nmeros de bper  - Dr. Kowalski: [phone]  - Dra. Moye: [phone]  - Dra. Stewart: [phone]  En caso de inclemencias del tiempo, por favor llame a   nuestra lnea principal al [phone] para una actualizacin sobre el estado de cualquier retraso o cierre.  Consejos para la medicacin en dermatologa: Por favor, guarde las cajas en las que vienen los medicamentos de uso tpico para ayudarle a seguir las instrucciones sobre dnde y cmo usarlos. Las farmacias generalmente imprimen las instrucciones del medicamento slo en las cajas y no directamente en los tubos del medicamento.   Si su medicamento es muy caro, por favor, pngase en contacto con nuestra oficina llamando al [phone] y presione la opcin 4 o envenos un mensaje a travs de MyChart.   No podemos decirle cul ser su copago por los medicamentos por adelantado ya que esto es diferente dependiendo de la cobertura de su seguro. Sin embargo, es posible que podamos encontrar un medicamento sustituto a menor costo o llenar un formulario para que el seguro cubra el medicamento que se considera necesario.   Si se requiere una autorizacin previa para que su compaa de seguros cubra su medicamento, por favor permtanos de 1 a 2 das hbiles para completar este proceso.  Los precios de los medicamentos varan con frecuencia dependiendo del lugar de dnde se surte la receta y alguna farmacias pueden ofrecer precios ms baratos.  El sitio web www.goodrx.com tiene cupones para medicamentos de diferentes farmacias. Los  precios aqu no tienen en cuenta lo que podra costar con la ayuda del seguro (puede ser ms barato con su seguro), pero el sitio web puede darle el precio si no utiliz ningn seguro.  - Puede imprimir el cupn correspondiente y llevarlo con su receta a la farmacia.  - Tambin puede pasar por nuestra oficina durante el horario de atencin regular y recoger una tarjeta de cupones de GoodRx.  - Si necesita que su receta se enve electrnicamente a una farmacia diferente, informe a nuestra oficina a travs de [facility]Chart [facility] o por telfono llamando al [phone] y presione la opcin 4.  

## 2022-10-25 ENCOUNTER — Encounter: Payer: Self-pay | Admitting: Internal Medicine

## 2022-10-25 ENCOUNTER — Ambulatory Visit (INDEPENDENT_AMBULATORY_CARE_PROVIDER_SITE_OTHER): Payer: PPO | Admitting: Internal Medicine

## 2022-10-25 VITALS — BP 132/78 | HR 84 | Temp 97.8°F | Ht 63.0 in | Wt 135.0 lb

## 2022-10-25 DIAGNOSIS — I10 Essential (primary) hypertension: Secondary | ICD-10-CM | POA: Diagnosis not present

## 2022-10-25 NOTE — Assessment & Plan Note (Signed)
BP Readings from Last 3 Encounters:  10/25/22 132/78  10/16/22 (!) 154/89  10/03/22 124/80   Discussed that she needs to be calm and relaxed before taking it Counseled to seek help with neighbor's family--she can't deal with her worsening function alone Continue amlodipine 5/losartan 100--one Am, one evening

## 2022-10-25 NOTE — Progress Notes (Signed)
Subjective:    Patient ID: Katherine Gallegos, female    DOB: 1940/11/08, 82 y.o.   MRN: [id number]  HPI Here due to elevated blood pressure at home  Had not been taking the amlodipine every day Missed 5 days or more Noticed BP was very high one day 177/101--but pulse also 101 She was dealing with stressful situation with neighbor  No chest pain No SOB  Current Outpatient Medications on File Prior to Visit  Medication Sig Dispense Refill   acetaminophen (TYLENOL) 325 MG tablet Take 650 mg by mouth every 6 (six) hours as needed.     amLODipine (NORVASC) 5 MG tablet Take 1 tablet (5 mg total) by mouth daily. 90 tablet 3   losartan (COZAAR) 100 MG tablet Take 1 tablet (100 mg total) by mouth daily. 90 tablet 3   metroNIDAZOLE (METROCREAM) 0.75 % cream Apply to face at night for rosacea. Increase to twice daily with flares. 45 g 6   omeprazole (PRILOSEC) 20 MG capsule Take 1 capsule (20 mg total) by mouth daily. 90 capsule 3   No current facility-administered medications on file prior to visit.    Allergies  Allergen Reactions   Hydrochlorothiazide Rash   Latex Rash   Lovastatin Other (See Comments)    Muscle pain    Past Medical History:  Diagnosis Date   Allergic state    Arthritis    Basal cell carcinoma    Left upper back, treated many years ago.   FH: colon polyps    GERD (gastroesophageal reflux disease)    Hypertension    Skin cancer     Past Surgical History:  Procedure Laterality Date   ABDOMINAL HYSTERECTOMY  1977   Partial   APPENDECTOMY  1977   BREAST BIOPSY Bilateral 20 + yrs    excisional bx. negative results   BREAST BIOPSY Left 04/02/2016   benign-FIBROCYSTIC CHANGE   COLON SURGERY  2014   Large Polypectomy with Bowel Resection (Dr. Renda Rolls)   COLONOSCOPY N/A 12/08/2021   Procedure: COLONOSCOPY;  Surgeon: Jaynie Collins, DO;  Location: [facility] ENDOSCOPY;  Service: Gastroenterology;  Laterality: N/A;   COLONOSCOPY WITH  ESOPHAGOGASTRODUODENOSCOPY (EGD)     COLONOSCOPY WITH PROPOFOL N/A 06/13/2016   Procedure: COLONOSCOPY WITH PROPOFOL; adenomatous polyps Surgeon: Scot Jun, MD;  Location: [facility] ENDOSCOPY;  Service: Endoscopy;  Laterality: N/A;   ESOPHAGOGASTRODUODENOSCOPY N/A 12/08/2021   Procedure: ESOPHAGOGASTRODUODENOSCOPY (EGD);  Surgeon: Jaynie Collins, DO;  Location: [facility] ENDOSCOPY;  Service: Gastroenterology;  Laterality: N/A;   ESOPHAGOGASTRODUODENOSCOPY (EGD) WITH PROPOFOL N/A 08/21/2017   Procedure: ESOPHAGOGASTRODUODENOSCOPY (EGD) WITH PROPOFOL;  Surgeon: Scot Jun, MD;  Location: [facility] ENDOSCOPY;  Service: Endoscopy;  Laterality: N/A;   VEIN SURGERY  2010    Family History  Problem Relation Age of Onset   Heart disease Mother    Heart disease Father    Cancer Sister        bone ca.    Breast cancer Sister    Stroke Sister 30       from oral contraceptive   Lung cancer Sister    Lung cancer Brother    Hematuria Neg Hx    Kidney cancer Neg Hx    Kidney disease Neg Hx    Prostate cancer Neg Hx    Sickle cell trait Neg Hx    Tuberculosis Neg Hx     Social History   Socioeconomic History   Marital status: Widowed    Spouse name: Not on  file   Number of children: 3   Years of education: Not on file   Highest education level: Not on file  Occupational History   Occupation: Housekeeping    Employer: [employer]    Comment: Retired  Tobacco Use   Smoking status: Never    Passive exposure: Past   Smokeless tobacco: Never  Vaping Use   Vaping status: Never Used  Substance and Sexual Activity   Alcohol use: No   Drug use: No   Sexual activity: Never  Other Topics Concern   Not on file  Social History Narrative   Widowed 11/18   2 sons and a daughter      Has living will   Son Gala Romney (and other kids) should be health care POA   Would accept resuscitation but no prolonged machines or tube feeds   Social Determinants of Health   Financial Resource  Strain: Not on file  Food Insecurity: Not on file  Transportation Needs: Not on file  Physical Activity: Not on file  Stress: Not on file  Social Connections: Not on file  Intimate Partner Violence: Not on file   Review of Systems Sleeps okay Asks about how to deal with neighbor    Objective:   Physical Exam Constitutional:      Appearance: Normal appearance.  Cardiovascular:     Rate and Rhythm: Normal rate and regular rhythm.     Heart sounds: No murmur heard.    No gallop.  Pulmonary:     Effort: Pulmonary effort is normal.     Breath sounds: Normal breath sounds. No wheezing or rales.  Musculoskeletal:     Cervical back: Neck supple.  Lymphadenopathy:     Cervical: No cervical adenopathy.  Neurological:     Mental Status: She is alert.  Psychiatric:        Mood and Affect: Mood normal.        Behavior: Behavior normal.            Assessment & Plan:

## 2022-11-08 ENCOUNTER — Other Ambulatory Visit: Payer: PPO

## 2022-12-21 ENCOUNTER — Other Ambulatory Visit: Payer: PPO

## 2022-12-24 ENCOUNTER — Other Ambulatory Visit (INDEPENDENT_AMBULATORY_CARE_PROVIDER_SITE_OTHER): Payer: PPO

## 2022-12-24 DIAGNOSIS — E538 Deficiency of other specified B group vitamins: Secondary | ICD-10-CM

## 2022-12-24 LAB — VITAMIN B12: Vitamin B-12: 677 pg/mL (ref 211–911)

## 2023-02-04 ENCOUNTER — Ambulatory Visit (INDEPENDENT_AMBULATORY_CARE_PROVIDER_SITE_OTHER): Payer: PPO | Admitting: Internal Medicine

## 2023-02-04 ENCOUNTER — Encounter: Payer: Self-pay | Admitting: Internal Medicine

## 2023-02-04 VITALS — BP 138/84 | HR 82 | Temp 97.8°F | Ht 63.0 in | Wt 134.0 lb

## 2023-02-04 DIAGNOSIS — L659 Nonscarring hair loss, unspecified: Secondary | ICD-10-CM | POA: Insufficient documentation

## 2023-02-04 NOTE — Progress Notes (Signed)
Subjective:    Patient ID: Katherine Gallegos, female    DOB: 11-02-1940, 82 y.o.   MRN: [id number]  HPI Here with concerns about hair loss  Concerned about her hair Notes a lot of hair in her comb---and notices it on her clothes No focal area of hair loss  Using Head and Shoulders for a couple of months---now something else Can feel little bumps at times But no itching or flaking now  No chemicals or coloring Does use curling iron and low heat blow dryer  Current Outpatient Medications on File Prior to Visit  Medication Sig Dispense Refill   acetaminophen (TYLENOL) 325 MG tablet Take 650 mg by mouth every 6 (six) hours as needed.     amLODipine (NORVASC) 5 MG tablet Take 1 tablet (5 mg total) by mouth daily. 90 tablet 3   losartan (COZAAR) 100 MG tablet Take 1 tablet (100 mg total) by mouth daily. 90 tablet 3   metroNIDAZOLE (METROCREAM) 0.75 % cream Apply to face at night for rosacea. Increase to twice daily with flares. 45 g 6   omeprazole (PRILOSEC) 20 MG capsule Take 1 capsule (20 mg total) by mouth daily. 90 capsule 3   No current facility-administered medications on file prior to visit.    Allergies  Allergen Reactions   Hydrochlorothiazide Rash   Latex Rash   Lovastatin Other (See Comments)    Muscle pain    Past Medical History:  Diagnosis Date   Allergic state    Arthritis    Basal cell carcinoma    Left upper back, treated many years ago.   FH: colon polyps    GERD (gastroesophageal reflux disease)    Hypertension    Skin cancer     Past Surgical History:  Procedure Laterality Date   ABDOMINAL HYSTERECTOMY  1977   Partial   APPENDECTOMY  1977   BREAST BIOPSY Bilateral 20 + yrs    excisional bx. negative results   BREAST BIOPSY Left 04/02/2016   benign-FIBROCYSTIC CHANGE   COLON SURGERY  2014   Large Polypectomy with Bowel Resection (Dr. Renda Rolls)   COLONOSCOPY N/A 12/08/2021   Procedure: COLONOSCOPY;  Surgeon: Jaynie Collins, DO;   Location: [facility] ENDOSCOPY;  Service: Gastroenterology;  Laterality: N/A;   COLONOSCOPY WITH ESOPHAGOGASTRODUODENOSCOPY (EGD)     COLONOSCOPY WITH PROPOFOL N/A 06/13/2016   Procedure: COLONOSCOPY WITH PROPOFOL; adenomatous polyps Surgeon: Scot Jun, MD;  Location: [facility] ENDOSCOPY;  Service: Endoscopy;  Laterality: N/A;   ESOPHAGOGASTRODUODENOSCOPY N/A 12/08/2021   Procedure: ESOPHAGOGASTRODUODENOSCOPY (EGD);  Surgeon: Jaynie Collins, DO;  Location: [facility] ENDOSCOPY;  Service: Gastroenterology;  Laterality: N/A;   ESOPHAGOGASTRODUODENOSCOPY (EGD) WITH PROPOFOL N/A 08/21/2017   Procedure: ESOPHAGOGASTRODUODENOSCOPY (EGD) WITH PROPOFOL;  Surgeon: Scot Jun, MD;  Location: [facility] ENDOSCOPY;  Service: Endoscopy;  Laterality: N/A;   VEIN SURGERY  2010    Family History  Problem Relation Age of Onset   Heart disease Mother    Heart disease Father    Cancer Sister        bone ca.    Breast cancer Sister    Stroke Sister 30       from oral contraceptive   Lung cancer Sister    Lung cancer Brother    Hematuria Neg Hx    Kidney cancer Neg Hx    Kidney disease Neg Hx    Prostate cancer Neg Hx    Sickle cell trait Neg Hx    Tuberculosis Neg Hx  Social History   Socioeconomic History   Marital status: Widowed    Spouse name: Not on file   Number of children: 3   Years of education: Not on file   Highest education level: Not on file  Occupational History   Occupation: Housekeeping    Employer: [employer]    Comment: Retired  Tobacco Use   Smoking status: Never    Passive exposure: Past   Smokeless tobacco: Never  Vaping Use   Vaping status: Never Used  Substance and Sexual Activity   Alcohol use: No   Drug use: No   Sexual activity: Never  Other Topics Concern   Not on file  Social History Narrative   Widowed 11/18   2 sons and a daughter      Has living will   Son Gala Romney (and other kids) should be health care POA   Would accept resuscitation but no  prolonged machines or tube feeds   Social Determinants of Health   Financial Resource Strain: Not on file  Food Insecurity: Not on file  Transportation Needs: Not on file  Physical Activity: Not on file  Stress: Not on file  Social Connections: Not on file  Intimate Partner Violence: Not on file   Review of Systems Weight is stable Energy levels are okay--feels better on the B12 No recent trauma/infection recently     Objective:   Physical Exam Constitutional:      Appearance: Normal appearance.  Skin:    Comments: No scalp lesions Some general thinning of hair mostly frontal No focal hair loss  Neurological:     Mental Status: She is alert.            Assessment & Plan:

## 2023-02-04 NOTE — Assessment & Plan Note (Signed)
No scalp lesions No stress/infection to explain loss Possible telogen effluvium Thyroid normal in July---will hold off on repeating blood work Discussed adding a multivitamin---especially biotin

## 2023-02-04 NOTE — Patient Instructions (Signed)
Please start a multivitamin---like Centrum Silver

## 2023-04-09 ENCOUNTER — Other Ambulatory Visit: Payer: Self-pay | Admitting: Internal Medicine

## 2023-08-02 IMAGING — MG MM DIGITAL SCREENING BILAT W/ TOMO AND CAD
8 series · 9 of 24 positions shown · non-contrast
Comparison: Previous exam(s).

CLINICAL DATA: Screening.

EXAM:
DIGITAL SCREENING BILATERAL MAMMOGRAM WITH TOMOSYNTHESIS AND CAD
TECHNIQUE: Bilateral screening digital craniocaudal and mediolateral oblique
mammograms were obtained. Bilateral screening digital breast
tomosynthesis was performed. The images were evaluated with
computer-aided detection.

[L MLO synth-2D]
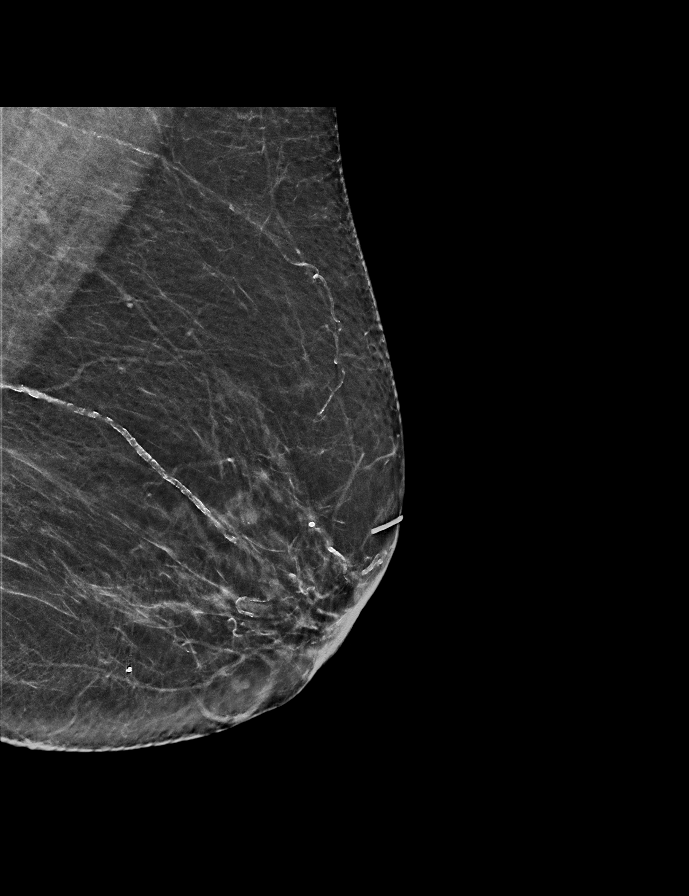

[R CC synth-2D]
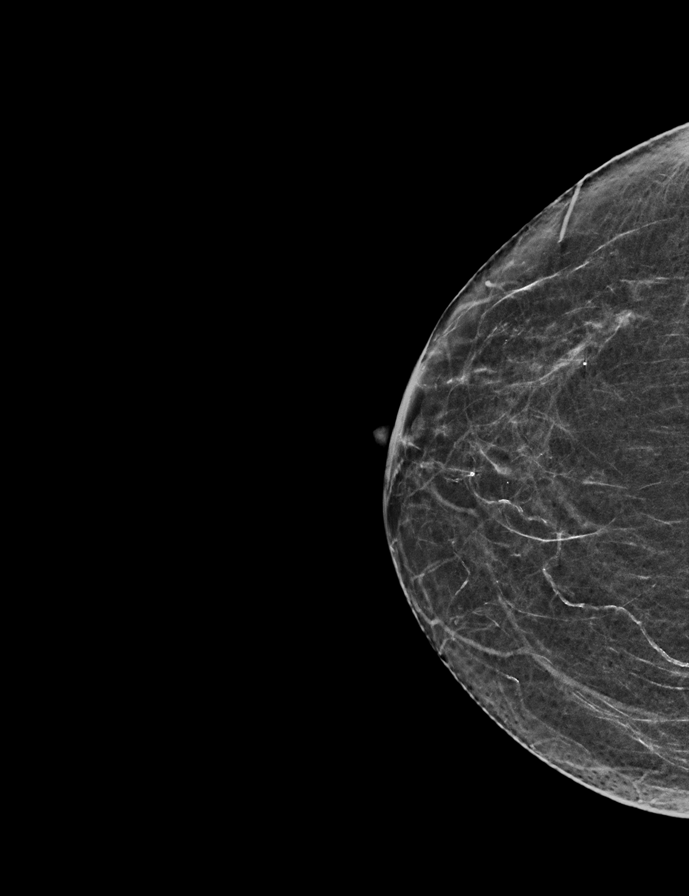

[L CC synth-2D]
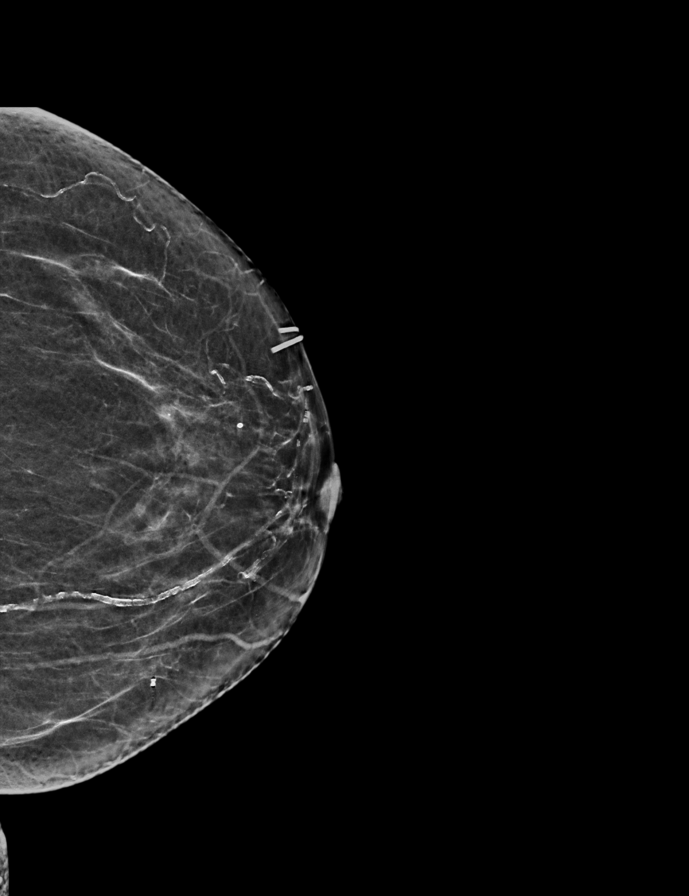

[R MLO synth-2D]
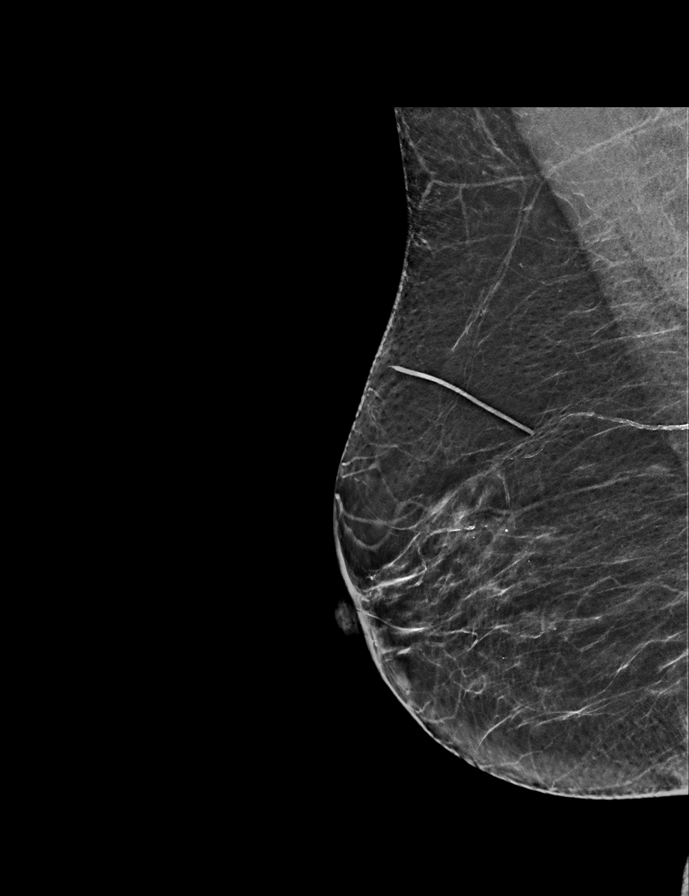

[R CC tomo · 2 of 51 frames shown]
[frame 17/51]
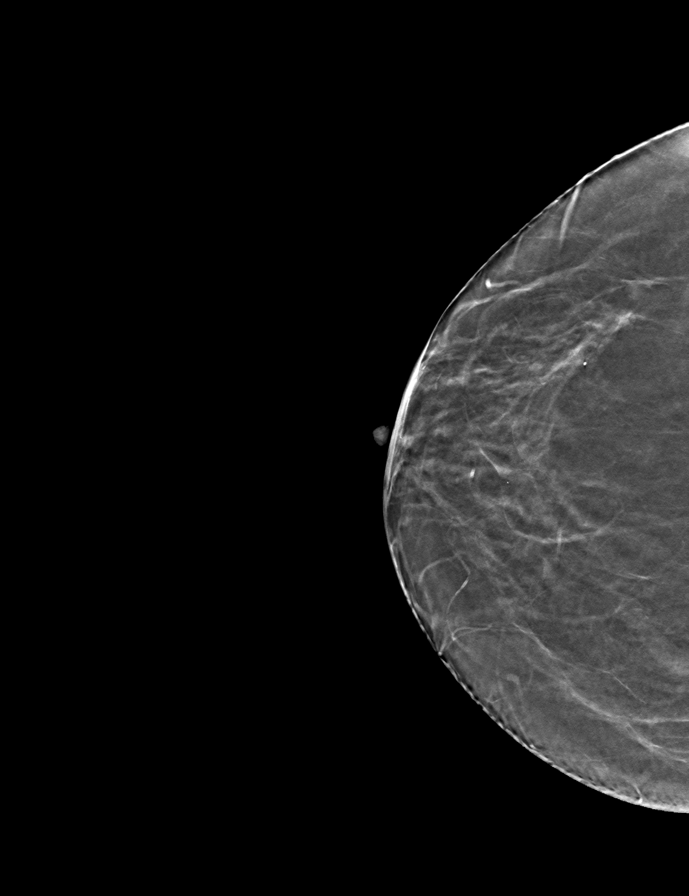
[frame 26/51]
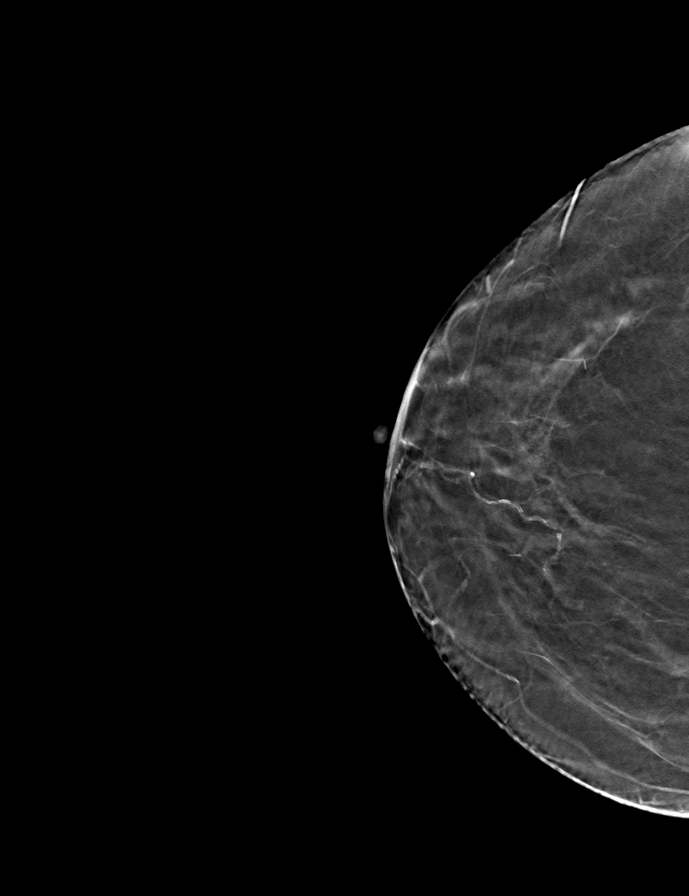

[R MLO tomo · tomo slice 30/59.0]
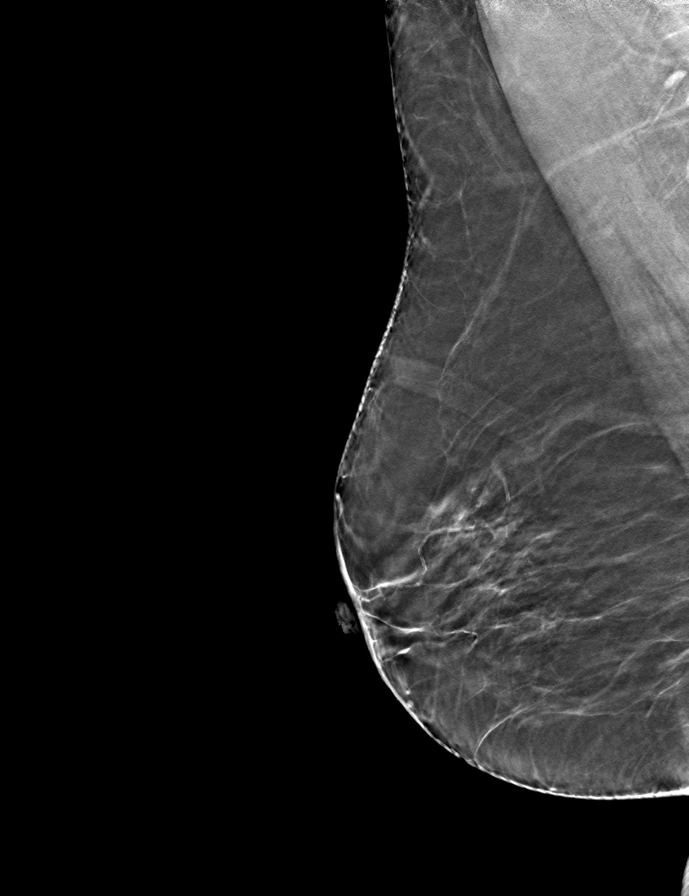

[L CC tomo · tomo slice 27/53.0]
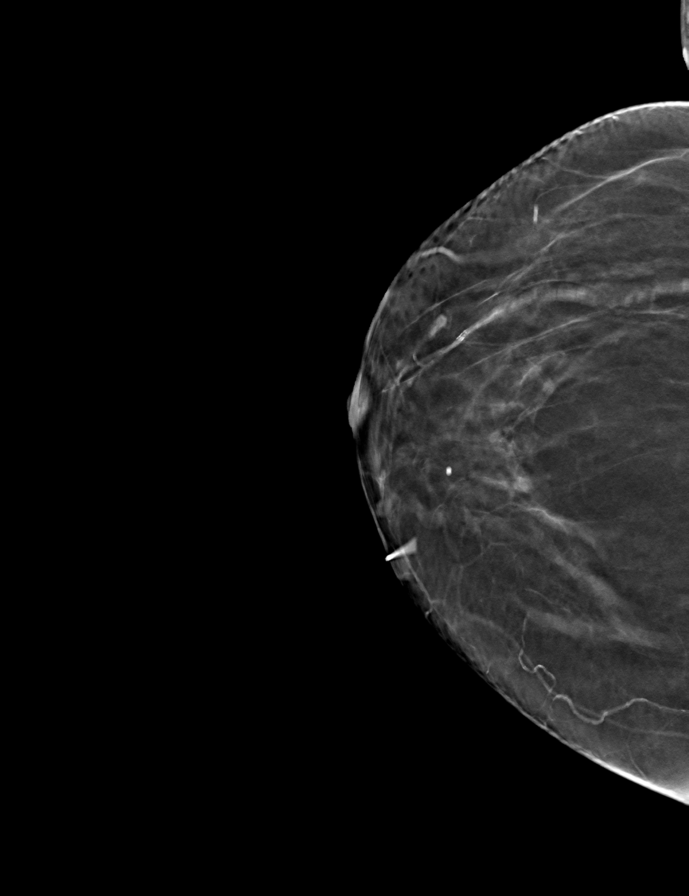

[L MLO tomo · tomo slice 30/59.0]
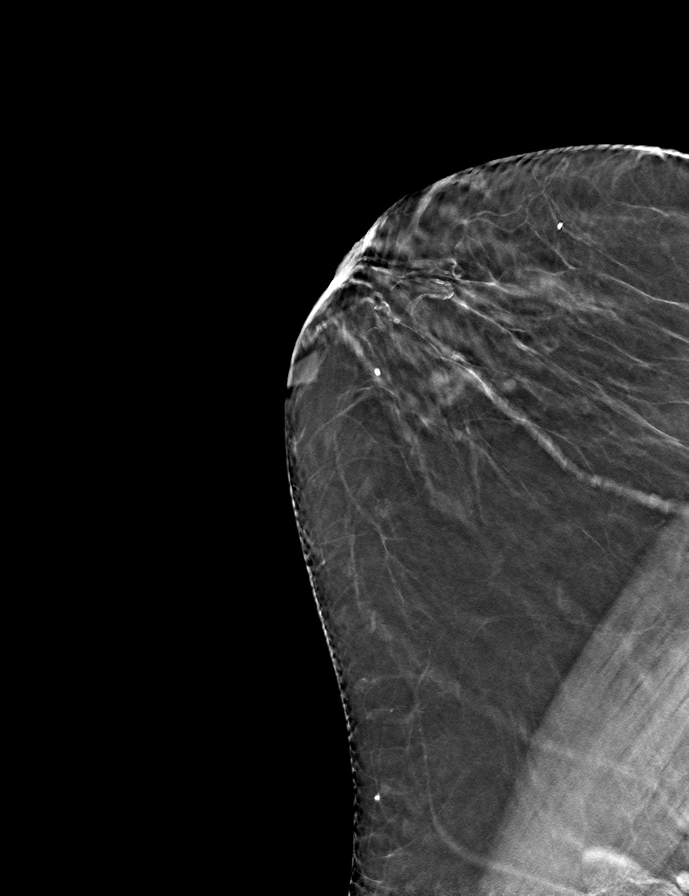

[9 of 24 positions shown; findings below may reference images not displayed]

ACR Breast Density Category b: There are scattered areas of
fibroglandular density.
FINDINGS: There are no findings suspicious for malignancy.
IMPRESSION: No mammographic evidence of malignancy. A result letter of this
screening mammogram will be mailed directly to the patient.

RECOMMENDATION:
Screening mammogram in one year. (Code:51-O-LD2)

BI-RADS CATEGORY  1: Negative.

## 2023-09-03 ENCOUNTER — Ambulatory Visit

## 2023-09-05 ENCOUNTER — Ambulatory Visit

## 2023-09-05 VITALS — BP 138/84 | Ht 63.0 in | Wt 129.0 lb

## 2023-09-05 DIAGNOSIS — Z0000 Encounter for general adult medical examination without abnormal findings: Secondary | ICD-10-CM

## 2023-09-05 NOTE — Patient Instructions (Signed)
 Katherine Gallegos , Thank you for taking time out of your busy schedule to complete your Annual Wellness Visit with me. I enjoyed our conversation and look forward to speaking with you again next year. I, as well as your care team,  appreciate your ongoing commitment to your health goals. Please review the following plan we discussed and let me know if I can assist you in the future. Your Game plan/ To Do List    Referrals: If you haven't heard from the office you've been referred to, please reach out to them at the phone provided.  none Follow up Visits: Next Medicare AWV with our clinical staff: 09/08/2024   Have you seen your provider in the last 6 months (3 months if uncontrolled diabetes)? No Next Office Visit with your provider: 10/16/2023  Clinician Recommendations:  Aim for 30 minutes of exercise or brisk walking, 6-8 glasses of water, and 5 servings of fruits and vegetables each day.       This is a list of the screening recommended for you and due dates:  Health Maintenance  Topic Date Due   DTaP/Tdap/Td vaccine (1 - Tdap) Never done   DEXA scan (bone density measurement)  Never done   Pneumococcal Vaccine for age over 4 (2 of 2 - PCV) 04/10/2014   COVID-19 Vaccine (3 - Moderna risk series) 07/03/2019   Medicare Annual Wellness Visit  10/03/2023   Flu Shot  10/25/2023   Zoster (Shingles) Vaccine  Completed   HPV Vaccine  Aged Out   Meningitis B Vaccine  Aged Out    Advanced directives: (Declined) Advance directive discussed with you today. Even though you declined this today, please call our office should you change your mind, and we can give you the proper paperwork for you to fill out. Advance Care Planning is important because it:  [x]  Makes sure you receive the medical care that is consistent with your values, goals, and preferences  [x]  It provides guidance to your family and loved ones and reduces their decisional burden about whether or not they are making the right  decisions based on your wishes.  Follow the link provided in your after visit summary or read over the paperwork we have mailed to you to help you started getting your Advance Directives in place. If you need assistance in completing these, please reach out to us  so that we can help you!  See attachments for Preventive Care and Fall Prevention Tips.

## 2023-09-05 NOTE — Progress Notes (Signed)
 Because this visit was a virtual/telehealth visit,  certain criteria was not obtained, such a blood pressure, CBG if applicable, and timed get up and go. Any medications not marked as taking were not mentioned during the medication reconciliation part of the visit. Any vitals not documented were not able to be obtained due to this being a telehealth visit or patient was unable to self-report a recent blood pressure reading due to a lack of equipment at home via telehealth. Vitals that have been documented are verbally provided by the patient.  This visit was performed by a medical professional under my direct supervision. I was immediately available for consultation/collaboration. I have reviewed and agree with the Annual Wellness Visit documentation.  Subjective:   Katherine Gallegos is a 83 y.o. who presents for a Medicare Wellness preventive visit.  As a reminder, Annual Wellness Visits don't include a physical exam, and some assessments may be limited, especially if this visit is performed virtually. We may recommend an in-person follow-up visit with your provider if needed.  Visit Complete: Virtual I connected with  Katherine Gallegos on 09/05/23 by a audio enabled telemedicine application and verified that I am speaking with the correct person using two identifiers.  Patient Location: Home  Provider Location: Home Office  I discussed the limitations of evaluation and management by telemedicine. The patient expressed understanding and agreed to proceed.  Vital Signs: Because this visit was a virtual/telehealth visit, some criteria may be missing or patient reported. Any vitals not documented were not able to be obtained and vitals that have been documented are patient reported.  VideoDeclined- This patient declined Librarian, academic. Therefore the visit was completed with audio only.  Persons Participating in Visit: Patient.  AWV Questionnaire: No: Patient  Medicare AWV questionnaire was not completed prior to this visit.  Cardiac Risk Factors include: advanced age (>19men, >68 women);hypertension     Objective:    Today's Vitals   09/05/23 1556  BP: 138/84  Weight: 129 lb (58.5 kg)  Height: 5' 3 (1.6 m)   Body mass index is 22.85 kg/m.     09/05/2023    3:56 PM 12/08/2021    8:29 AM 08/21/2017   10:37 AM  Advanced Directives  Does Patient Have a Medical Advance Directive? No Yes No;Yes   Type of Advance Directive  Living will;Healthcare Power of [employer] Power of [place];Living will  Copy of Healthcare Power of Attorney in Chart?  No - copy requested No - copy requested   Would patient like information on creating a medical advance directive? No - Patient declined       Data saved with a previous flowsheet row definition    Current Medications (verified) Outpatient Encounter Medications as of 09/05/2023  Medication Sig   acetaminophen (TYLENOL) 325 MG tablet Take 650 mg by mouth every 6 (six) hours as needed.   amLODipine  (NORVASC ) 5 MG tablet Take 1 tablet by mouth once daily   cyanocobalamin  (VITAMIN B12) 1000 MCG tablet Take 1,000 mcg by mouth daily.   losartan  (COZAAR ) 100 MG tablet Take 1 tablet (100 mg total) by mouth daily.   metroNIDAZOLE  (METROCREAM ) 0.75 % cream Apply to face at night for rosacea. Increase to twice daily with flares.   omeprazole  (PRILOSEC) 20 MG capsule Take 1 capsule (20 mg total) by mouth daily.   No facility-administered encounter medications on file as of 09/05/2023.    Allergies (verified) Hydrochlorothiazide, Latex, and Lovastatin   History: Past Medical  History:  Diagnosis Date   Allergic state    Arthritis    Basal cell carcinoma    Left upper back, treated many years ago.   FH: colon polyps    GERD (gastroesophageal reflux disease)    Hypertension    Skin cancer    Past Surgical History:  Procedure Laterality Date   ABDOMINAL HYSTERECTOMY  1977   Partial    APPENDECTOMY  1977   BREAST BIOPSY Bilateral 20 + yrs    excisional bx. negative results   BREAST BIOPSY Left 04/02/2016   benign-FIBROCYSTIC CHANGE   COLON SURGERY  2014   Large Polypectomy with Bowel Resection (Dr. Hortensia Ma)   COLONOSCOPY N/A 12/08/2021   Procedure: COLONOSCOPY;  Surgeon: Quintin Buckle, DO;  Location: [facility] ENDOSCOPY;  Service: Gastroenterology;  Laterality: N/A;   COLONOSCOPY WITH ESOPHAGOGASTRODUODENOSCOPY (EGD)     COLONOSCOPY WITH PROPOFOL  N/A 06/13/2016   Procedure: COLONOSCOPY WITH PROPOFOL ; adenomatous polyps Surgeon: Cassie Click, MD;  Location[facility] ENDOSCOPY;  Service: Endoscopy;  Laterality: N/A;   ESOPHAGOGASTRODUODENOSCOPY N/A 12/08/2021   Procedure: ESOPHAGOGASTRODUODENOSCOPY (EGD);  Surgeon: Quintin Buckle, DO;  Location: [facility] ENDOSCOPY;  Service: Gastroenterology;  Laterality: N/A;   ESOPHAGOGASTRODUODENOSCOPY (EGD) WITH PROPOFOL  N/A 08/21/2017   Procedure: ESOPHAGOGASTRODUODENOSCOPY (EGD) WITH PROPOFOL ;  Surgeon: Cassie Click, MD;  Location: [facility] ENDOSCOPY;  Service: Endoscopy;  Laterality: N/A;   VEIN SURGERY  2010   Family History  Problem Relation Age of Onset   Heart disease Mother    Heart disease Father    Cancer Sister        bone ca.    Breast cancer Sister    Stroke Sister 30       from oral contraceptive   Lung cancer Sister    Lung cancer Brother    Hematuria Neg Hx    Kidney cancer Neg Hx    Kidney disease Neg Hx    Prostate cancer Neg Hx    Sickle cell trait Neg Hx    Tuberculosis Neg Hx    Social History   Socioeconomic History   Marital status: Widowed    Spouse name: Not on file   Number of children: 3   Years of education: Not on file   Highest education level: Not on file  Occupational History   Occupation: Housekeeping    Employer: [employer]    Comment: Retired  Tobacco Use   Smoking status: Never    Passive exposure: Past   Smokeless tobacco: Never  Vaping Use   Vaping  status: Never Used  Substance and Sexual Activity   Alcohol use: No   Drug use: No   Sexual activity: Never  Other Topics Concern   Not on file  Social History Narrative   Widowed 11/18   2 sons and a daughter      Has living will   Son Georgette Kins (and other kids) should be health care POA   Would accept resuscitation but no prolonged machines or tube feeds   Social Drivers of Health   Financial Resource Strain: Low Risk  (09/05/2023)   Overall Financial Resource Strain (CARDIA)    Difficulty of Paying Living Expenses: Not hard at all  Food Insecurity: No Food Insecurity (09/05/2023)   Hunger Vital Sign    Worried About Running Out of Food in the Last Year: Never true    Ran Out of Food in the Last Year: Never true  Transportation Needs: No Transportation Needs (09/05/2023)  PRAPARE - Administrator, Civil Service (Medical): No    Lack of Transportation (Non-Medical): No  Physical Activity: Sufficiently Active (09/05/2023)   Exercise Vital Sign    Days of Exercise per Week: 7 days    Minutes of Exercise per Session: 30 min  Stress: No Stress Concern Present (09/05/2023)   [employer] of Occupational Health - Occupational Stress Questionnaire    Feeling of Stress: Not at all  Social Connections: Moderately Integrated (09/05/2023)   Social Connection and Isolation Panel    Frequency of Communication with Friends and Family: More than three times a week    Frequency of Social Gatherings with Friends and Family: More than three times a week    Attends Religious Services: More than 4 times per year    Active Member of [employer] or Organizations: Yes    Attends Banker Meetings: More than 4 times per year    Marital Status: Widowed    Tobacco Counseling Counseling given: Not Answered    Clinical Intake:  Pre-visit preparation completed: Yes  Pain : No/denies pain     BMI - recorded: 22.85 Nutritional Status: BMI of 19-24  Normal Nutritional Risks:  None Diabetes: No  No results found for: HGBA1C   How often do you need to have someone help you when you read instructions, pamphlets, or other written materials from your doctor or pharmacy?: 1 - Never         Activities of Daily Living     09/05/2023    3:59 PM  In your present state of health, do you have any difficulty performing the following activities:  Hearing? 0  Vision? 0  Difficulty concentrating or making decisions? 0  Walking or climbing stairs? 0  Dressing or bathing? 0  Doing errands, shopping? 0  Preparing Food and eating ? N  Using the Toilet? N  In the past six months, have you accidently leaked urine? N  Do you have problems with loss of bowel control? N  Managing your Medications? N  Managing your Finances? N  Housekeeping or managing your Housekeeping? N    Patient Care Team: Helaine Llanos, MD as PCP - General (Internal Medicine)  I have updated your Care Teams any recent Medical Services you may have received from other providers in the past year.     Assessment:   This is a routine wellness examination for Somis.  Hearing/Vision screen Hearing Screening - Comments:: Patient has no trouble hearing Vision Screening - Comments:: Patient wears readers   Goals Addressed             This Visit's Progress    Patient Stated       Stay healthy       Depression Screen     09/05/2023    4:00 PM 10/03/2022    7:57 AM 10/03/2022    7:34 AM 04/02/2022    8:55 AM  PHQ 2/9 Scores  PHQ - 2 Score 0 0 0 0  PHQ- 9 Score 0       Fall Risk     09/05/2023    3:59 PM 10/03/2022    7:57 AM 10/03/2022    7:34 AM  Fall Risk   Falls in the past year? 0 1 1  Number falls in past yr: 0 0 0  Injury with Fall? 0 0 0  Risk for fall due to : No Fall Risks  No Fall Risks  Follow up Falls evaluation  completed  Falls evaluation completed    MEDICARE RISK AT HOME:  Medicare Risk at Home Any stairs in or around the home?: Yes If so, are there any  without handrails?: No Home free of loose throw rugs in walkways, pet beds, electrical cords, etc?: Yes Adequate lighting in your home to reduce risk of falls?: Yes Life alert?: No Use of a cane, walker or w/c?: No Grab bars in the bathroom?: Yes Shower chair or bench in shower?: Yes Elevated toilet seat or a handicapped toilet?: Yes  TIMED UP AND GO:  Was the test performed?  No  Cognitive Function: 6CIT completed        09/05/2023    3:58 PM  6CIT Screen  What Year? 0 points  What month? 0 points  What time? 0 points  Count back from 20 0 points  Months in reverse 0 points  Repeat phrase 0 points  Total Score 0 points    Immunizations Immunization History  Administered Date(s) Administered   Influenza, High Dose Seasonal PF 12/11/2017, 12/09/2018   Influenza-Unspecified 12/11/2021, 12/25/2022   Moderna Sars-Covid-2 Vaccination 05/08/2019, 06/05/2019   Pneumococcal Polysaccharide-23 04/10/2013   RSV,unspecified 12/25/2022   Zoster Recombinant(Shingrix) 12/25/2017, 02/24/2018   Zoster, Live 01/28/2015    Screening Tests Health Maintenance  Topic Date Due   DTaP/Tdap/Td (1 - Tdap) Never done   DEXA SCAN  Never done   Pneumococcal Vaccine: 50+ Years (2 of 2 - PCV) 04/10/2014   COVID-19 Vaccine (3 - Moderna risk series) 07/03/2019   INFLUENZA VACCINE  10/25/2023   Medicare Annual Wellness (AWV)  09/04/2024   Zoster Vaccines- Shingrix  Completed   HPV VACCINES  Aged Out   Meningococcal B Vaccine  Aged Out    Health Maintenance  Health Maintenance Due  Topic Date Due   DTaP/Tdap/Td (1 - Tdap) Never done   DEXA SCAN  Never done   Pneumococcal Vaccine: 50+ Years (2 of 2 - PCV) 04/10/2014   COVID-19 Vaccine (3 - Moderna risk series) 07/03/2019   Health Maintenance Items Addressed:patient declined any orders from health maintenance    Additional Screening:  Vision Screening: Recommended annual ophthalmology exams for early detection of glaucoma and other  disorders of the eye. Would you like a referral to an eye doctor? No    Dental Screening: Recommended annual dental exams for proper oral hygiene  Community Resource Referral / Chronic Care Management: CRR required this visit?  No   CCM required this visit?  No   Plan:    I have personally reviewed and noted the following in the patient's chart:   Medical and social history Use of alcohol, tobacco or illicit drugs  Current medications and supplements including opioid prescriptions. Patient is not currently taking opioid prescriptions. Functional ability and status Nutritional status Physical activity Advanced directives List of other physicians Hospitalizations, surgeries, and ER visits in previous 12 months Vitals Screenings to include cognitive, depression, and falls Referrals and appointments  In addition, I have reviewed and discussed with patient certain preventive protocols, quality metrics, and best practice recommendations. A written personalized care plan for preventive services as well as general preventive health recommendations were provided to patient.   Freeda Jerry, [place]   09/05/2023   After Visit Summary: (MyChart) Due to this being a telephonic visit, the after visit summary with patients personalized plan was offered to patient via MyChart   Notes: Nothing significant to report at this time.

## 2023-10-04 ENCOUNTER — Other Ambulatory Visit: Payer: Self-pay | Admitting: Internal Medicine

## 2023-10-08 ENCOUNTER — Encounter: Payer: PPO | Admitting: Internal Medicine

## 2023-10-09 ENCOUNTER — Ambulatory Visit: Admitting: Dermatology

## 2023-10-09 ENCOUNTER — Encounter: Payer: Self-pay | Admitting: Dermatology

## 2023-10-09 DIAGNOSIS — Z1283 Encounter for screening for malignant neoplasm of skin: Secondary | ICD-10-CM

## 2023-10-09 DIAGNOSIS — L720 Epidermal cyst: Secondary | ICD-10-CM | POA: Diagnosis not present

## 2023-10-09 DIAGNOSIS — L814 Other melanin hyperpigmentation: Secondary | ICD-10-CM | POA: Diagnosis not present

## 2023-10-09 DIAGNOSIS — L820 Inflamed seborrheic keratosis: Secondary | ICD-10-CM | POA: Diagnosis not present

## 2023-10-09 DIAGNOSIS — I781 Nevus, non-neoplastic: Secondary | ICD-10-CM | POA: Diagnosis not present

## 2023-10-09 DIAGNOSIS — D2261 Melanocytic nevi of right upper limb, including shoulder: Secondary | ICD-10-CM

## 2023-10-09 DIAGNOSIS — Z808 Family history of malignant neoplasm of other organs or systems: Secondary | ICD-10-CM

## 2023-10-09 DIAGNOSIS — D2272 Melanocytic nevi of left lower limb, including hip: Secondary | ICD-10-CM

## 2023-10-09 DIAGNOSIS — L738 Other specified follicular disorders: Secondary | ICD-10-CM

## 2023-10-09 DIAGNOSIS — L719 Rosacea, unspecified: Secondary | ICD-10-CM

## 2023-10-09 DIAGNOSIS — L578 Other skin changes due to chronic exposure to nonionizing radiation: Secondary | ICD-10-CM | POA: Diagnosis not present

## 2023-10-09 DIAGNOSIS — Z85828 Personal history of other malignant neoplasm of skin: Secondary | ICD-10-CM

## 2023-10-09 DIAGNOSIS — L821 Other seborrheic keratosis: Secondary | ICD-10-CM | POA: Diagnosis not present

## 2023-10-09 DIAGNOSIS — W908XXA Exposure to other nonionizing radiation, initial encounter: Secondary | ICD-10-CM | POA: Diagnosis not present

## 2023-10-09 DIAGNOSIS — L300 Nummular dermatitis: Secondary | ICD-10-CM

## 2023-10-09 DIAGNOSIS — D1801 Hemangioma of skin and subcutaneous tissue: Secondary | ICD-10-CM

## 2023-10-09 DIAGNOSIS — D229 Melanocytic nevi, unspecified: Secondary | ICD-10-CM

## 2023-10-09 MED ORDER — METRONIDAZOLE 0.75 % EX CREA: TOPICAL_CREAM | CUTANEOUS | 11 refills | Status: AC

## 2023-10-09 NOTE — Progress Notes (Signed)
 Follow-Up Visit   Subjective  Katherine Gallegos is a 83 y.o. female who presents for the following: Skin Cancer Screening and Full Body Skin Exam  The patient presents for Total-Body Skin Exam (TBSE) for skin cancer screening and mole check. The patient has spots, moles and lesions to be evaluated, some may be new or changing. She has an irritated spot on her nose, rubbed by glasses. History of BCC at upper back. Also new pink spot on arm.   The following portions of the chart were reviewed this encounter and updated as appropriate: medications, allergies, medical history  Review of Systems:  No other skin or systemic complaints except as noted in HPI or Assessment and Plan.  Objective  Well appearing patient in no apparent distress; mood and affect are within normal limits.  A full examination was performed including scalp, head, eyes, ears, nose, lips, neck, chest, axillae, abdomen, back, buttocks, bilateral upper extremities, bilateral lower extremities, hands, feet, fingers, toes, fingernails, and toenails. All findings within normal limits unless otherwise noted below.   Relevant physical exam findings are noted in the Assessment and Plan.  R upper elbow x 1 Erythematous stuck-on, waxy papule  Assessment & Plan   SKIN CANCER SCREENING PERFORMED TODAY.  ACTINIC DAMAGE - Chronic condition, secondary to cumulative UV/sun exposure - diffuse scaly erythematous macules with underlying dyspigmentation - Recommend daily broad spectrum sunscreen SPF 30+ to sun-exposed areas, reapply every 2 hours as needed.  - Staying in the shade or wearing long sleeves, sun glasses (UVA+UVB protection) and wide brim hats (4-inch brim around the entire circumference of the hat) are also recommended for sun protection.  - Call for new or changing lesions.  LENTIGINES, SEBORRHEIC KERATOSES, HEMANGIOMAS - Benign normal skin lesions - Benign-appearing - Call for any changes  MELANOCYTIC NEVI -  Tan-brown and/or pink-flesh-colored symmetric macules and papules; including flesh papules right shoulder - Left anterior thigh, 5 mm thin brown papule - Left plantar foot, 2 mm brown macule - Benign appearing on exam today - Observation - Call clinic for new or changing moles - Recommend daily use of broad spectrum spf 30+ sunscreen to sun-exposed areas.   HISTORY OF BASAL CELL CARCINOMA OF THE SKIN Patient reports history of bcc at left upper back area  - No evidence of recurrence today - Recommend regular full body skin exams - Recommend daily broad spectrum sunscreen SPF 30+ to sun-exposed areas, reapply every 2 hours as needed.  - Call if any new or changing lesions are noted between office visits   FAMILY HISTORY OF SKIN CANCER What type(s):melanoma Who affected:daughter  Sebaceous Hyperplasia - Small yellow papules with a central dell on face; left nasolabial 4 mm; right lower cheek 4 mm - Benign-appearing - Observe. Call for changes.  Milia - tiny firm white papule nasal dorsum, paranasal - type of cyst - benign - sometimes these will clear with nightly OTC adapalene/Differin 0.1% gel or retinol. - may be extracted if symptomatic - observe  TELANGIECTASIA Exam: Blanching pink macule left upper nasal dorsum  Treatment Plan: Benign appearing on exam Call for changes  ROSACEA Exam Mid face erythema with telangiectasias   Chronic condition with duration or expected duration over one year. Currently well-controlled.    Rosacea is a chronic progressive skin condition usually affecting the face of adults, causing redness and/or acne bumps. It is treatable but not curable. It sometimes affects the eyes (ocular rosacea) as well. It may respond to topical and/or systemic medication  and can flare with stress, sun exposure, alcohol, exercise, topical steroids (including hydrocortisone/cortisone 10) and some foods.  Daily application of broad spectrum spf 30+ sunscreen to  face is recommended to reduce flares.  Patient denies grittiness of the eyes  Treatment Plan Continue metronidazole  0.75% cream, especially to nose since it has been more irritated  Nummular Dermatitis Exam: Pink scaly patches at upper back  Chronic and persistent condition with duration or expected duration over one year. Condition is symptomatic/ bothersome to patient. Not currently at goal.   Nummular dermatitis (eczema) is a chronic, relapsing, itchy rash that can significantly affect quality of life. It is often associated with dry skin and flares in the wintertime, and may require treatment with prescription topical anti-inflammatory medications, in addition to gentle skin care.  If there is associated atopic dermatitis and topicals are not working, then biologic injections may be necessary to clear rash and control symptoms.  Treatment Plan: Recommend mild soap and moisturizing cream 1-2 times daily.  Gentle skin care handout provided.    INFLAMED SEBORRHEIC KERATOSIS R upper elbow x 1 Symptomatic, irritating, patient would like treated. Destruction of lesion - R upper elbow x 1  Destruction method: cryotherapy   Informed consent: discussed and consent obtained   Lesion destroyed using liquid nitrogen: Yes   Region frozen until ice ball extended beyond lesion: Yes   Outcome: patient tolerated procedure well with no complications   Post-procedure details: wound care instructions given   Additional details:  Prior to procedure, discussed risks of blister formation, small wound, skin dyspigmentation, or rare scar following cryotherapy. Recommend Vaseline ointment to treated areas while healing.   Return in about 1 year (around 10/08/2024) for TBSE, Hx BCC.  IAndrea Kerns, CMA, am acting as scribe for Rexene Rattler, MD .   Documentation: I have reviewed the above documentation for accuracy and completeness, and I agree with the above.  Rexene Rattler, MD

## 2023-10-09 NOTE — Patient Instructions (Addendum)
 Cryotherapy Aftercare  Wash gently with soap and water everyday.   Apply Vaseline and Band-Aid daily until healed.   Continue metronidazole  0.75% cream to nose every night for rosacea.  Gentle Skin Care Guide  1. Bathe no more than once a day.  2. Avoid bathing in hot water  3. Use a mild soap like Dove, Vanicream, Cetaphil, CeraVe. Can use Lever 2000 or Cetaphil antibacterial soap  4. Use soap only where you need it. On most days, use it under your arms, between your legs, and on your feet. Let the water rinse other areas unless visibly dirty.  5. When you get out of the bath/shower, use a towel to gently blot your skin dry, don't rub it.  6. While your skin is still a little damp, apply a moisturizing cream such as Vanicream, CeraVe, Cetaphil, Eucerin, Sarna lotion or plain Vaseline Jelly. For hands apply Neutrogena [place] Hand Cream or Excipial Hand Cream.  7. Reapply moisturizer any time you start to itch or feel dry.  8. Sometimes using free and clear laundry detergents can be helpful. Fabric softener sheets should be avoided. Downy Free & Gentle liquid, or any liquid fabric softener that is free of dyes and perfumes, it acceptable to use  9. If your doctor has given you prescription creams you may apply moisturizers over them    Melanoma ABCDEs  Melanoma is the most dangerous type of skin cancer, and is the leading cause of death from skin disease.  You are more likely to develop melanoma if you: Have light-colored skin, light-colored eyes, or red or blond hair Spend a lot of time in the sun Tan regularly, either outdoors or in a tanning bed Have had blistering sunburns, especially during childhood Have a close family member who has had a melanoma Have atypical moles or large birthmarks  Early detection of melanoma is key since treatment is typically straightforward and cure rates are extremely high if we catch it early.   The first sign of melanoma is often a change  in a mole or a new dark spot.  The ABCDE system is a way of remembering the signs of melanoma.  A for asymmetry:  The two halves do not match. B for border:  The edges of the growth are irregular. C for color:  A mixture of colors are present instead of an even brown color. D for diameter:  Melanomas are usually (but not always) greater than 6mm - the size of a pencil eraser. E for evolution:  The spot keeps changing in size, shape, and color.  Please check your skin once per month between visits. You can use a small mirror in front and a large mirror behind you to keep an eye on the back side or your body.   If you see any new or changing lesions before your next follow-up, please call to schedule a visit.  Please continue daily skin protection including broad spectrum sunscreen SPF 30+ to sun-exposed areas, reapplying every 2 hours as needed when you're outdoors.   Staying in the shade or wearing long sleeves, sun glasses (UVA+UVB protection) and wide brim hats (4-inch brim around the entire circumference of the hat) are also recommended for sun protection.    Due to recent changes in healthcare laws, you may see results of your pathology and/or laboratory studies on MyChart before the doctors have had a chance to review them. We understand that in some cases there may be results that are confusing or  concerning to you. Please understand that not all results are received at the same time and often the doctors may need to interpret multiple results in order to provide you with the best plan of care or course of treatment. Therefore, we ask that you please give us  2 business days to thoroughly review all your results before contacting the office for clarification. Should we see a critical lab result, you will be contacted sooner.   If You Need Anything After Your Visit  If you have any questions or concerns for your doctor, please call our main line at [phone] and press option 4 to reach  your doctor's medical assistant. If no one answers, please leave a voicemail as directed and we will return your call as soon as possible. Messages left after 4 pm will be answered the following business day.   You may also send us  a message via MyChart. We typically respond to MyChart messages within 1-2 business days.  For prescription refills, please ask your pharmacy to contact our office. Our fax number is [phone].  If you have an urgent issue when the clinic is closed that cannot wait until the next business day, you can page your doctor at the number below.    Please note that while we do our best to be available for urgent issues outside of office hours, we are not available 24/7.   If you have an urgent issue and are unable to reach us , you may choose to seek medical care at your doctor's office, retail clinic, urgent care center, or emergency room.  If you have a medical emergency, please immediately call 911 or go to the emergency department.  Pager Numbers  - Dr. Hester: [phone]  - Dr. Jackquline: [phone]  - Dr. Claudene: [phone]   In the event of inclement weather, please call our main line at [phone] for an update on the status of any delays or closures.  Dermatology Medication Tips: Please keep the boxes that topical medications come in in order to help keep track of the instructions about where and how to use these. Pharmacies typically print the medication instructions only on the boxes and not directly on the medication tubes.   If your medication is too expensive, please contact our office at [phone] option 4 or send us  a message through MyChart.   We are unable to tell what your co-pay for medications will be in advance as this is different depending on your insurance coverage. However, we may be able to find a substitute medication at lower cost or fill out paperwork to get insurance to cover a needed medication.   If a prior authorization  is required to get your medication covered by your insurance company, please allow us  1-2 business days to complete this process.  Drug prices often vary depending on where the prescription is filled and some pharmacies may offer cheaper prices.  The website www.goodrx.com contains coupons for medications through different pharmacies. The prices here do not account for what the cost may be with help from insurance (it may be cheaper with your insurance), but the website can give you the price if you did not use any insurance.  - You can print the associated coupon and take it with your prescription to the pharmacy.  - You may also stop by our office during regular business hours and pick up a GoodRx coupon card.  - If you need your prescription sent electronically to a different pharmacy, notify our office  through [facility] or by phone at [phone] option 4.     Si Usted Necesita Algo Despus de Su Visita  Tambin puede enviarnos un mensaje a travs de Clinical cytogeneticist. Por lo general respondemos a los mensajes de MyChart en el transcurso de 1 a 2 das hbiles.  Para renovar recetas, por favor pida a su farmacia que se ponga en contacto con nuestra oficina. Randi lakes de fax es [place] [phone].  Si tiene un asunto urgente cuando la clnica est cerrada y que no puede esperar hasta el siguiente da hbil, puede llamar/localizar a su doctor(a) al nmero que aparece a continuacin.   Por favor, tenga en cuenta que aunque hacemos todo lo posible para estar disponibles para asuntos urgentes fuera del horario de [place], no estamos disponibles las 24 horas del da, los 7 [address]  [address] de la [place].   Si tiene un problema urgente y no puede comunicarse con nosotros, puede optar por buscar atencin mdica  en el consultorio de su doctor(a), en una clnica privada, en un centro de atencin urgente o en una sala de emergencias.  Si tiene Engineer, drilling, por favor llame inmediatamente al 911 o  vaya a la sala de emergencias.  Nmeros de bper  - Dr. Hester: [phone]  - Dra. Jackquline: [phone]  - Dr. Claudene: [phone]   En caso de inclemencias del tiempo, por favor llame a landry capes principal al [phone] para una actualizacin sobre el [place] de cualquier retraso o cierre.  Consejos para la medicacin en dermatologa: Por favor, guarde las cajas en las que vienen los medicamentos de uso tpico para ayudarle a seguir las instrucciones sobre dnde y cmo usarlos. Las farmacias generalmente imprimen las instrucciones del medicamento slo en las cajas y no directamente en los tubos del [place].   Si su medicamento es muy caro, por favor, pngase en contacto con landry rieger llamando al [phone] y presione la opcin 4 o envenos un mensaje a travs de Clinical cytogeneticist.   No podemos decirle cul ser su copago por los medicamentos por adelantado ya que esto es diferente dependiendo de la cobertura de su seguro. Sin embargo, es posible que podamos encontrar un medicamento sustituto a Audiological scientist un formulario para que el seguro cubra el medicamento que se considera necesario.   Si se requiere una autorizacin previa para que su compaa de seguros malta su medicamento, por favor permtanos de 1 a 2 das hbiles para completar este proceso.  Los precios de los medicamentos varan con frecuencia dependiendo del Environmental consultant de dnde se surte la receta y alguna farmacias pueden ofrecer precios ms baratos.  El sitio web www.goodrx.com tiene cupones para medicamentos de Health and safety inspector. Los precios aqu no tienen en cuenta lo que podra costar con la ayuda del seguro (puede ser ms barato con su seguro), pero el sitio web puede darle el precio si no utiliz Tourist information centre manager.  - Puede imprimir el cupn correspondiente y llevarlo con su receta a la farmacia.  - Tambin puede pasar por nuestra oficina durante el horario de atencin regular y Education officer, museum una tarjeta de cupones  de GoodRx.  - Si necesita que su receta se enve electrnicamente a una farmacia diferente, informe a nuestra oficina a travs de MyChart de [place] o por telfono llamando al [phone] y presione la opcin 4.

## 2023-10-16 ENCOUNTER — Ambulatory Visit: Payer: Self-pay | Admitting: Internal Medicine

## 2023-10-16 ENCOUNTER — Ambulatory Visit (INDEPENDENT_AMBULATORY_CARE_PROVIDER_SITE_OTHER): Admitting: Internal Medicine

## 2023-10-16 ENCOUNTER — Encounter: Payer: Self-pay | Admitting: Internal Medicine

## 2023-10-16 VITALS — BP 122/80 | HR 96 | Temp 98.4°F | Ht 62.5 in | Wt 128.0 lb

## 2023-10-16 DIAGNOSIS — I10 Essential (primary) hypertension: Secondary | ICD-10-CM

## 2023-10-16 DIAGNOSIS — I872 Venous insufficiency (chronic) (peripheral): Secondary | ICD-10-CM | POA: Diagnosis not present

## 2023-10-16 DIAGNOSIS — R739 Hyperglycemia, unspecified: Secondary | ICD-10-CM | POA: Insufficient documentation

## 2023-10-16 DIAGNOSIS — Z0000 Encounter for general adult medical examination without abnormal findings: Secondary | ICD-10-CM

## 2023-10-16 DIAGNOSIS — K529 Noninfective gastroenteritis and colitis, unspecified: Secondary | ICD-10-CM

## 2023-10-16 LAB — COMPREHENSIVE METABOLIC PANEL WITH GFR
ALT: 11 U/L (ref 0–35)
AST: 14 U/L (ref 0–37)
Albumin: 4.4 g/dL (ref 3.5–5.2)
Alkaline Phosphatase: 54 U/L (ref 39–117)
BUN: 12 mg/dL (ref 6–23)
CO2: 29 meq/L (ref 19–32)
Calcium: 9.9 mg/dL (ref 8.4–10.5)
Chloride: 99 meq/L (ref 96–112)
Creatinine, Ser: 0.72 mg/dL (ref 0.40–1.20)
GFR: 78.51 mL/min (ref >60.00)
Glucose, Bld: 118 mg/dL — ABNORMAL HIGH (ref 70–99)
Potassium: 4.1 meq/L (ref 3.5–5.1)
Sodium: 136 meq/L (ref 135–145)
Total Bilirubin: 0.7 mg/dL (ref 0.2–1.2)
Total Protein: 7.5 g/dL (ref 6.0–8.3)

## 2023-10-16 LAB — HEMOGLOBIN A1C: Hgb A1c MFr Bld: 5.9 % (ref 4.6–6.5)

## 2023-10-16 LAB — CBC
HCT: 38.6 % (ref 36.0–46.0)
Hemoglobin: 12.9 g/dL (ref 12.0–15.0)
MCHC: 33.4 g/dL (ref 30.0–36.0)
MCV: 87 fl (ref 78.0–100.0)
Platelets: 252 K/uL (ref 150.0–400.0)
RBC: 4.43 Mil/uL (ref 3.87–5.11)
RDW: 12.6 % (ref 11.5–15.5)
WBC: 7.4 K/uL (ref 4.0–10.5)

## 2023-10-16 NOTE — Assessment & Plan Note (Signed)
 Healthy Discussed exercise Done with cancer screening Flu vaccine in the fall---will also need Td Prefers no COVID vaccine

## 2023-10-16 NOTE — Assessment & Plan Note (Signed)
 BP Readings from Last 3 Encounters:  10/16/23 122/80  09/05/23 138/84  02/04/23 138/84   Good control on amlodipine  5mg  and losartan  100 daily

## 2023-10-16 NOTE — Progress Notes (Signed)
 Subjective:    Patient ID: Katherine Gallegos, female    DOB: 03-Mar-1941, 83 y.o.   MRN: [id number]  HPI Here for physical  Has been feeling lousy Thinks she may have had bad chicken 4-5 days ago Dragging and diarrhea No N/V Has improved some--but still not great Appetite still off--drinking okay and slight eating No blood in stools ---- max was 4-5 stools in a day No known ill exposures  Takes the omeprazole  less often--just prn Avoids fried and spicy foods No dysphagia  Current Outpatient Medications on File Prior to Visit  Medication Sig Dispense Refill   acetaminophen (TYLENOL) 325 MG tablet Take 650 mg by mouth every 6 (six) hours as needed.     amLODipine  (NORVASC ) 5 MG tablet Take 1 tablet by mouth once daily 90 tablet 3   cyanocobalamin  (VITAMIN B12) 1000 MCG tablet Take 1,000 mcg by mouth daily.     losartan  (COZAAR ) 100 MG tablet Take 1 tablet by mouth once daily 90 tablet 0   metroNIDAZOLE  (METROCREAM ) 0.75 % cream Apply to face at night for rosacea. Increase to twice daily with flares. 45 g 11   omeprazole  (PRILOSEC) 20 MG capsule Take 1 capsule (20 mg total) by mouth daily. 90 capsule 3   No current facility-administered medications on file prior to visit.    Allergies  Allergen Reactions   Hydrochlorothiazide Rash   Latex Rash   Lovastatin Other (See Comments)    Muscle pain    Past Medical History:  Diagnosis Date   Allergic state    Arthritis    Basal cell carcinoma    Left upper back, treated many years ago.   FH: colon polyps    GERD (gastroesophageal reflux disease)    Hypertension    Skin cancer     Past Surgical History:  Procedure Laterality Date   ABDOMINAL HYSTERECTOMY  1977   Partial   APPENDECTOMY  1977   BREAST BIOPSY Bilateral 20 + yrs    excisional bx. negative results   BREAST BIOPSY Left 04/02/2016   benign-FIBROCYSTIC CHANGE   COLON SURGERY  2014   Large Polypectomy with Bowel Resection (Dr. Unknown Sharps)   COLONOSCOPY  N/A 12/08/2021   Procedure: COLONOSCOPY;  Surgeon: Onita Elspeth Sharper, DO;  Location: [facility] ENDOSCOPY;  Service: Gastroenterology;  Laterality: N/A;   COLONOSCOPY WITH ESOPHAGOGASTRODUODENOSCOPY (EGD)     COLONOSCOPY WITH PROPOFOL  N/A 06/13/2016   Procedure: COLONOSCOPY WITH PROPOFOL ; adenomatous polyps Surgeon: Lamar ONEIDA Holmes, MD;  Location: [facility] ENDOSCOPY;  Service: Endoscopy;  Laterality: N/A;   ESOPHAGOGASTRODUODENOSCOPY N/A 12/08/2021   Procedure: ESOPHAGOGASTRODUODENOSCOPY (EGD);  Surgeon: Onita Elspeth Sharper, DO;  Location: [facility] ENDOSCOPY;  Service: Gastroenterology;  Laterality: N/A;   ESOPHAGOGASTRODUODENOSCOPY (EGD) WITH PROPOFOL  N/A 08/21/2017   Procedure: ESOPHAGOGASTRODUODENOSCOPY (EGD) WITH PROPOFOL ;  Surgeon: Holmes Lamar ONEIDA, MD;  Location: [facility] ENDOSCOPY;  Service: Endoscopy;  Laterality: N/A;   VEIN SURGERY  2010    Family History  Problem Relation Age of Onset   Heart disease Mother    Heart disease Father    Cancer Sister        bone ca.    Breast cancer Sister    Stroke Sister 30       from oral contraceptive   Lung cancer Sister    Lung cancer Brother    Melanoma Daughter    Hematuria Neg Hx    Kidney cancer Neg Hx    Kidney disease Neg Hx    Prostate cancer Neg Hx  Sickle cell trait Neg Hx    Tuberculosis Neg Hx     Social History   Socioeconomic History   Marital status: Widowed    Spouse name: Not on file   Number of children: 3   Years of education: Not on file   Highest education level: Not on file  Occupational History   Occupation: Housekeeping    Employer: [employer]    Comment: Retired  Tobacco Use   Smoking status: Never    Passive exposure: Past   Smokeless tobacco: Never  Vaping Use   Vaping status: Never Used  Substance and Sexual Activity   Alcohol use: No   Drug use: No   Sexual activity: Never  Other Topics Concern   Not on file  Social History Narrative   Widowed 11/18   2 sons and a daughter      Has  living will   Son Georgette (and other kids) should be health care POA   Would accept resuscitation but no prolonged machines or tube feeds   Social Drivers of Health   Financial Resource Strain: Low Risk  (09/05/2023)   Overall Financial Resource Strain (CARDIA)    Difficulty of Paying Living Expenses: Not hard at all  Food Insecurity: No Food Insecurity (09/05/2023)   Hunger Vital Sign    Worried About Running Out of Food in the Last Year: Never true    Ran Out of Food in the Last Year: Never true  Transportation Needs: No Transportation Needs (09/05/2023)   PRAPARE - Administrator, Civil Service (Medical): No    Lack of Transportation (Non-Medical): No  Physical Activity: Sufficiently Active (09/05/2023)   Exercise Vital Sign    Days of Exercise per Week: 7 days    Minutes of Exercise per Session: 30 min  Stress: No Stress Concern Present (09/05/2023)   [employer] of Occupational Health - Occupational Stress Questionnaire    Feeling of Stress: Not at all  Social Connections: Moderately Integrated (09/05/2023)   Social Connection and Isolation Panel    Frequency of Communication with Friends and Family: More than three times a week    Frequency of Social Gatherings with Friends and Family: More than three times a week    Attends Religious Services: More than 4 times per year    Active Member of [employer] or Organizations: Yes    Attends Banker Meetings: More than 4 times per year    Marital Status: Widowed  Intimate Partner Violence: Not At Risk (09/05/2023)   Humiliation, Afraid, Rape, and Kick questionnaire    Fear of Current or Ex-Partner: No    Emotionally Abused: No    Physically Abused: No    Sexually Abused: No   Review of Systems  Constitutional:        Weight down another 7#---doesn't eat as much Did used to weigh 100# in early 30's Walks some Wears seat belt  HENT:  Negative for dental problem, hearing loss, tinnitus and trouble swallowing.         Keeps up with dentist  Eyes:  Negative for visual disturbance.       No diplopia or unilateral vision loss  Respiratory:  Negative for cough, chest tightness and shortness of breath.   Cardiovascular:  Negative for chest pain and palpitations.       Left leg swelling--varicosities. Does wear support hose  Gastrointestinal:  Negative for blood in stool.  Endocrine: Negative for polydipsia and polyuria.  Genitourinary:  Negative for dysuria and hematuria.  Musculoskeletal:  Negative for arthralgias and joint swelling.       Occ back pain---better with rest  Skin:  Negative for rash.       No suspicious lesions now Recent cryotherapy at derm  Allergic/Immunologic: Positive for environmental allergies. Negative for immunocompromised state.       No meds lately  Neurological:  Negative for dizziness, syncope, light-headedness and headaches.  Hematological:  Negative for adenopathy. Does not bruise/bleed easily.  Psychiatric/Behavioral:  Negative for dysphoric mood and sleep disturbance. The patient is not nervous/anxious.        Objective:   Physical Exam Constitutional:      Appearance: Normal appearance.  HENT:     Mouth/Throat:     Pharynx: No oropharyngeal exudate or posterior oropharyngeal erythema.  Eyes:     Conjunctiva/sclera: Conjunctivae normal.     Pupils: Pupils are equal, round, and reactive to light.  Cardiovascular:     Rate and Rhythm: Normal rate and regular rhythm.     Pulses: Normal pulses.     Heart sounds: No murmur heard.    No gallop.  Pulmonary:     Effort: Pulmonary effort is normal.     Breath sounds: Normal breath sounds. No wheezing or rales.  Abdominal:     Palpations: Abdomen is soft.     Tenderness: There is no abdominal tenderness.  Musculoskeletal:     Cervical back: Neck supple.     Right lower leg: No edema.     Comments: Mild left calf edema--varicosities  Lymphadenopathy:     Cervical: No cervical adenopathy.  Skin:    Findings:  No rash.  Neurological:     General: No focal deficit present.     Mental Status: She is alert and oriented to person, place, and time.  Psychiatric:        Mood and Affect: Mood normal.        Behavior: Behavior normal.            Assessment & Plan:

## 2023-10-16 NOTE — Assessment & Plan Note (Signed)
Uses support hose

## 2023-10-16 NOTE — Assessment & Plan Note (Signed)
 Seems to be getting back to normal May have been mild food poisoning

## 2023-10-17 ENCOUNTER — Ambulatory Visit: Payer: Self-pay

## 2023-10-17 NOTE — Telephone Encounter (Signed)
 Spoke to pt. She has been sitting and resting with her feet elevated. BP a few mins ago was 125/75 90 HR. She is feeling better. She does not think she needs an appt.

## 2023-10-17 NOTE — Telephone Encounter (Signed)
 FYI Only or Action Required?: FYI only for provider.  Patient was last seen in primary care on 10/16/2023 by Jimmy Charlie FERNS, MD.  Called Nurse Triage reporting Hypertension.  Symptoms began several days ago.  Interventions attempted: Prescription medications: amlodipine  and losartan  and Rest, hydration, or home remedies.  Symptoms are: unchanged.  Triage Disposition: See PCP When Office is Open (Within 3 Days)-patient recommended to Urgent Care as she wanted to be seen today. No availability in clinic today.   Patient/caregiver understands and will follow disposition?: Yes  Copied from CRM [id number]. Topic: Clinical - Red Word Triage >> Oct 17, 2023  9:23 AM Vena H wrote: Red Word that prompted transfer to Nurse Triage: Pt states she took her blood pressure and it was 144/90 and HR 101 and states she doesn't feel good at all , feels weak Reason for Disposition  [1] Taking BP medications AND [2] feels is having side effects (e.g., impotence, cough, dizzy upon standing)  Answer Assessment - Initial Assessment Questions 1. BLOOD PRESSURE: What is your blood pressure? Did you take at least two measurements 5 minutes apart?     144/90 HR 101  140/84  2. ONSET: When did you take your blood pressure?     First reading done an hour prior to calling, second reading done while on the phone with Nurse Triage 3. HOW: How did you take your blood pressure? (e.g., automatic home BP monitor, visiting nurse)      Automatic blood pressure 4. HISTORY: Do you have a history of high blood pressure?     yes 5. MEDICINES: Are you taking any medicines for blood pressure? Have you missed any doses recently?     Amlodipine , losartan  6. OTHER SYMPTOMS: Do you have any symptoms? (e.g., blurred vision, chest pain, difficulty breathing, headache, weakness)     Weakness, fatigue, reports doesn't feel well  Patient is recommended to Urgent Care as there are no appointments available in the  office today. Patient verbalized understanding of plan but hung up the phone before this RN was able to finish out the call.  Protocols used: Blood Pressure - High-A-AH

## 2023-10-17 NOTE — Telephone Encounter (Signed)
 Left message to call office. When pt calls back, please tell her to be here at 140 to see Dr Jimmy today. Let us  know if she is not able to do that time.

## 2023-10-28 ENCOUNTER — Ambulatory Visit: Payer: PPO | Admitting: Dermatology

## 2023-11-13 ENCOUNTER — Ambulatory Visit (INDEPENDENT_AMBULATORY_CARE_PROVIDER_SITE_OTHER): Admitting: Family Medicine

## 2023-11-13 ENCOUNTER — Telehealth: Payer: Self-pay

## 2023-11-13 ENCOUNTER — Ambulatory Visit: Payer: Self-pay

## 2023-11-13 VITALS — BP 120/70 | HR 89 | Temp 98.1°F | Ht 62.5 in | Wt 124.4 lb

## 2023-11-13 DIAGNOSIS — I10 Essential (primary) hypertension: Secondary | ICD-10-CM

## 2023-11-13 DIAGNOSIS — R5383 Other fatigue: Secondary | ICD-10-CM | POA: Insufficient documentation

## 2023-11-13 LAB — CBC WITH DIFFERENTIAL/PLATELET
Basophils Absolute: 0 K/uL (ref 0.0–0.1)
Basophils Relative: 0.5 % (ref 0.0–3.0)
Eosinophils Absolute: 0 K/uL (ref 0.0–0.7)
Eosinophils Relative: 0.4 % (ref 0.0–5.0)
HCT: 39.6 % (ref 36.0–46.0)
Hemoglobin: 12.9 g/dL (ref 12.0–15.0)
Lymphocytes Relative: 22.8 % (ref 12.0–46.0)
Lymphs Abs: 2 K/uL (ref 0.7–4.0)
MCHC: 32.6 g/dL (ref 30.0–36.0)
MCV: 88.6 fl (ref 78.0–100.0)
Monocytes Absolute: 0.5 K/uL (ref 0.1–1.0)
Monocytes Relative: 5.9 % (ref 3.0–12.0)
Neutro Abs: 6.3 K/uL (ref 1.4–7.7)
Neutrophils Relative %: 70.4 % (ref 43.0–77.0)
Platelets: 260 K/uL (ref 150.0–400.0)
RBC: 4.47 Mil/uL (ref 3.87–5.11)
RDW: 13.3 % (ref 11.5–15.5)
WBC: 8.9 K/uL (ref 4.0–10.5)

## 2023-11-13 LAB — COMPREHENSIVE METABOLIC PANEL WITH GFR
ALT: 9 U/L (ref 0–35)
AST: 12 U/L (ref 0–37)
Albumin: 4.3 g/dL (ref 3.5–5.2)
Alkaline Phosphatase: 49 U/L (ref 39–117)
BUN: 11 mg/dL (ref 6–23)
CO2: 31 meq/L (ref 19–32)
Calcium: 9.7 mg/dL (ref 8.4–10.5)
Chloride: 95 meq/L — ABNORMAL LOW (ref 96–112)
Creatinine, Ser: 0.71 mg/dL (ref 0.40–1.20)
GFR: 79.8 mL/min (ref >60.00)
Glucose, Bld: 125 mg/dL — ABNORMAL HIGH (ref 70–99)
Potassium: 3.9 meq/L (ref 3.5–5.1)
Sodium: 136 meq/L (ref 135–145)
Total Bilirubin: 0.7 mg/dL (ref 0.2–1.2)
Total Protein: 7 g/dL (ref 6.0–8.3)

## 2023-11-13 LAB — VITAMIN B12: Vitamin B-12: 1173 pg/mL — ABNORMAL HIGH (ref 211–911)

## 2023-11-13 LAB — VITAMIN D 25 HYDROXY (VIT D DEFICIENCY, FRACTURES): VITD: 18.12 ng/mL — ABNORMAL LOW (ref 30.00–100.00)

## 2023-11-13 LAB — TSH: TSH: 0.71 u[IU]/mL (ref 0.35–5.50)

## 2023-11-13 NOTE — Progress Notes (Signed)
 Patient ID: Katherine Gallegos, female    DOB: 1941-01-07, 83 y.o.   MRN: [id number]  This visit was conducted in person.  BP 120/70   Pulse 89   Temp 98.1 F (36.7 C) (Oral)   Ht 5' 2.5 (1.588 m)   Wt 124 lb 6.4 oz (56.4 kg)   SpO2 96%   BMI 22.39 kg/m    CC:  Chief Complaint  Patient presents with   Hypertension    Pt complains of having high BP for a few days. States she hasn't been feeling good and is really tired.     Subjective:   HPI: Katherine Gallegos is a 83 y.o. female patient of Dr. Jimmy with history of hypertension and venous insufficiency presenting on 11/13/2023 for Hypertension (Pt complains of having high BP for a few days. States she hasn't been feeling good and is really tired. )  She has noted fatigue and not feeling well in the last several days.  No HA, no lightheaded. No CP, no SOB.  No cough or allergy symptoms.  Eating less lately. Drinks lots of water. She has been taking B12.. wonders if this is SE.  Hypertension:  She reports she has been having high blood pressure over the last several days.  BUT She has not checked BP but felt like it could be high. She has been compliant with her amlodipine  5 mg p.o. daily and losartan  100 mg p.o. daily BP Readings from Last 3 Encounters:  11/13/23 120/70  10/16/23 122/80  09/05/23 138/84  Using medication without problems or lightheadedness:  none Chest pain with exertion: none Edema: none Short of breath: none Average home BPs: Other issues:     Wt Readings from Last 3 Encounters:  11/13/23 124 lb 6.4 oz (56.4 kg)  10/16/23 128 lb (58.1 kg)  09/05/23 129 lb (58.5 kg)     Relevant past medical, surgical, family and social history reviewed and updated as indicated. Interim medical history since our last visit reviewed. Allergies and medications reviewed and updated. Outpatient Medications Prior to Visit  Medication Sig Dispense Refill   acetaminophen (TYLENOL) 325 MG tablet Take 650 mg by mouth  every 6 (six) hours as needed.     amLODipine  (NORVASC ) 5 MG tablet Take 1 tablet by mouth once daily 90 tablet 3   cyanocobalamin  (VITAMIN B12) 1000 MCG tablet Take 1,000 mcg by mouth daily.     losartan  (COZAAR ) 100 MG tablet Take 1 tablet by mouth once daily 90 tablet 0   metroNIDAZOLE  (METROCREAM ) 0.75 % cream Apply to face at night for rosacea. Increase to twice daily with flares. 45 g 11   omeprazole  (PRILOSEC) 20 MG capsule Take 1 capsule (20 mg total) by mouth daily. 90 capsule 3   No facility-administered medications prior to visit.     Per HPI unless specifically indicated in ROS section below Review of Systems  Constitutional:  Positive for fatigue. Negative for fever.  HENT:  Negative for congestion.   Eyes:  Negative for pain.  Respiratory:  Negative for cough and shortness of breath.   Cardiovascular:  Negative for chest pain, palpitations and leg swelling.  Gastrointestinal:  Negative for abdominal pain.  Genitourinary:  Negative for dysuria and vaginal bleeding.  Musculoskeletal:  Negative for back pain.  Neurological:  Negative for syncope, light-headedness and headaches.  Psychiatric/Behavioral:  Negative for dysphoric mood.    Objective:  BP 120/70   Pulse 89   Temp 98.1 F (36.7 C) (  Oral)   Ht 5' 2.5 (1.588 m)   Wt 124 lb 6.4 oz (56.4 kg)   SpO2 96%   BMI 22.39 kg/m   Wt Readings from Last 3 Encounters:  11/13/23 124 lb 6.4 oz (56.4 kg)  10/16/23 128 lb (58.1 kg)  09/05/23 129 lb (58.5 kg)      Physical Exam Constitutional:      General: She is not in acute distress.    Appearance: Normal appearance. She is well-developed. She is not ill-appearing or toxic-appearing.  HENT:     Head: Normocephalic.     Right Ear: Hearing, tympanic membrane, ear canal and external ear normal. Tympanic membrane is not erythematous, retracted or bulging.     Left Ear: Hearing, tympanic membrane, ear canal and external ear normal. Tympanic membrane is not erythematous,  retracted or bulging.     Nose: No mucosal edema or rhinorrhea.     Right Sinus: No maxillary sinus tenderness or frontal sinus tenderness.     Left Sinus: No maxillary sinus tenderness or frontal sinus tenderness.     Mouth/Throat:     Pharynx: Uvula midline.  Eyes:     General: Lids are normal. Lids are everted, no foreign bodies appreciated.     Conjunctiva/sclera: Conjunctivae normal.     Pupils: Pupils are equal, round, and reactive to light.  Neck:     Thyroid : No thyroid  mass or thyromegaly.     Vascular: No carotid bruit.     Trachea: Trachea normal.  Cardiovascular:     Rate and Rhythm: Normal rate and regular rhythm.     Pulses: Normal pulses.     Heart sounds: Normal heart sounds, S1 normal and S2 normal. No murmur heard.    No friction rub. No gallop.  Pulmonary:     Effort: Pulmonary effort is normal. No tachypnea or respiratory distress.     Breath sounds: Normal breath sounds. No decreased breath sounds, wheezing, rhonchi or rales.  Abdominal:     General: Bowel sounds are normal.     Palpations: Abdomen is soft.     Tenderness: There is no abdominal tenderness.  Musculoskeletal:     Cervical back: Normal range of motion and neck supple.  Skin:    General: Skin is warm and dry.     Findings: No rash.  Neurological:     Mental Status: She is alert.  Psychiatric:        Mood and Affect: Mood is not anxious or depressed.        Speech: Speech normal.        Behavior: Behavior normal. Behavior is cooperative.        Thought Content: Thought content normal.        Judgment: Judgment normal.       Results for orders placed or performed in visit on 10/16/23  Comprehensive metabolic panel with GFR   Collection Time: 10/16/23  9:35 AM  Result Value Ref Range   Sodium 136 135 - 145 mEq/L   Potassium 4.1 3.5 - 5.1 mEq/L   Chloride 99 96 - 112 mEq/L   CO2 29 19 - 32 mEq/L   Glucose, Bld 118 (H) 70 - 99 mg/dL   BUN 12 6 - 23 mg/dL   Creatinine, Ser 9.27 0.40 -  1.20 mg/dL   Total Bilirubin 0.7 0.2 - 1.2 mg/dL   Alkaline Phosphatase 54 39 - 117 U/L   AST 14 0 - 37 U/L   ALT 11 0 -  35 U/L   Total Protein 7.5 6.0 - 8.3 g/dL   Albumin 4.4 3.5 - 5.2 g/dL   GFR 21.48 >39.99 mL/min   Calcium 9.9 8.4 - 10.5 mg/dL  Hemoglobin J8r   Collection Time: 10/16/23  9:35 AM  Result Value Ref Range   Hgb A1c MFr Bld 5.9 4.6 - 6.5 %  CBC   Collection Time: 10/16/23  9:35 AM  Result Value Ref Range   WBC 7.4 4.0 - 10.5 K/uL   RBC 4.43 3.87 - 5.11 Mil/uL   Platelets 252.0 150.0 - 400.0 K/uL   Hemoglobin 12.9 12.0 - 15.0 g/dL   HCT 61.3 63.9 - 53.9 %   MCV 87.0 78.0 - 100.0 fl   MCHC 33.4 30.0 - 36.0 g/dL   RDW 87.3 88.4 - 84.4 %    Assessment and Plan  Essential hypertension Assessment & Plan: Chronic, well-controlled on current regimen.  Blood pressure does not seem to be elevated.  Encouraged patient to check her blood pressure at home with the cuff she has if she feels like it is abnormal so we can verify. We also did discuss having her take one of the blood pressure medicines in the morning and 1 at night to see if it will help stabilize any blood pressure fluctuations.  Continue current medications amlodipine  5 mg p.o. daily Losartan  100 mg p.o. daily   Other fatigue Assessment & Plan: Acute, with no specific symptoms.  No evidence of upper respiratory tract infection, urinary infection or GI infection. Will reevaluate with labs.  If nothing is found this could be a viral syndrome.  Return and ER precautions provided.  Orders: -     TSH -     CBC with Differential/Platelet -     VITAMIN D  25 Hydroxy (Vit-D Deficiency, Fractures) -     Comprehensive metabolic panel with GFR -     Vitamin B12    No follow-ups on file.   Greig Ring, MD

## 2023-11-13 NOTE — Telephone Encounter (Signed)
 Pt is seeing Dr Avelina today. Will send to her.

## 2023-11-13 NOTE — Assessment & Plan Note (Signed)
 Chronic, well-controlled on current regimen.  Blood pressure does not seem to be elevated.  Encouraged patient to check her blood pressure at home with the cuff she has if she feels like it is abnormal so we can verify. We also did discuss having her take one of the blood pressure medicines in the morning and 1 at night to see if it will help stabilize any blood pressure fluctuations.  Continue current medications amlodipine  5 mg p.o. daily Losartan  100 mg p.o. daily

## 2023-11-13 NOTE — Telephone Encounter (Signed)
 Patient evaluated at appointment.

## 2023-11-13 NOTE — Telephone Encounter (Signed)
 Copied from CRM #[id number]. Topic: General - Other >> Nov 13, 2023  8:32 AM Mercedes MATSU wrote: Reason for CRM: Patients daughter called in requesting a call from the doctor to go over the patients appointment and find out what happened. Patient has been very forgetful lately and the patients daughter just  really wants to be included and aware of what's going on with the patient. Patients [name] Sorrel can be reached at [phone].

## 2023-11-13 NOTE — Telephone Encounter (Signed)
 Called Katherine Gallegos and discussed patient appointment with her in detail today.  Encouraged her to set up MyChart. If continued issues with her mother's memory noted encouraged her to bring her in for follow-up.

## 2023-11-13 NOTE — Assessment & Plan Note (Signed)
 Acute, with no specific symptoms.  No evidence of upper respiratory tract infection, urinary infection or GI infection. Will reevaluate with labs.  If nothing is found this could be a viral syndrome.  Return and ER precautions provided.

## 2023-11-13 NOTE — Telephone Encounter (Signed)
 Appointment today 11/13/2023 at 9:40 AM with Dr Greig Ring  FYI Only or Action Required?: FYI only for provider.  Patient was last seen in primary care on 10/16/2023 by Jimmy Charlie FERNS, MD.  Called Nurse Triage reporting Hypertension.  Symptoms began a few days ago.  Interventions attempted: Prescription medications: blood pressure medications as prescribed and Rest, hydration, or home remedies.  Symptoms are: gradually worsening.  Triage Disposition: See PCP When Office is Open (Within 3 Days)  Patient/caregiver understands and will follow disposition?: Yes                    Copied from CRM #[id number]. Topic: Clinical - Red Word Triage >> Nov 13, 2023  8:00 AM Katherine Gallegos wrote: Red Word that prompted transfer to Nurse Triage: Patients blood pressure has been high for a few days ans she doesn't feel good has the shakes a little bit. Reason for Disposition  [1] Taking BP medications AND [2] feels is having side effects (e.g., impotence, cough, dizzy upon standing)  Answer Assessment - Initial Assessment Questions Patient concerned with blood pressure being high the past few days She states she just doesn't feel perky and denies any difficulty breathing, speaking, walking, or chest pain She states she just wants to get it checked out & she states no recent change in medications Patient is advised that if anything worsens to go to the Emergency Room. Patient verbalized understanding.    1. BLOOD PRESSURE: What is your blood pressure? Did you take at least two measurements 5 minutes apart?     7 am today 172/97 and 99 heart rate     8:10 AM  171/90 and 93 heart rate 2. ONSET: When did you take your blood pressure?     Automatic wrist cuff at home 3. HOW: How did you take your blood pressure? (e.g., automatic home BP monitor, visiting nurse)     Automatic wrist cuff at home 4. HISTORY: Do you have a history of high blood pressure?     Yes---takes  blood pressure medication 5. MEDICINES: Are you taking any medicines for blood pressure? Have you missed any doses recently?     ---- 6. OTHER SYMPTOMS: Do you have any symptoms? (e.g., blurred vision, chest pain, difficulty breathing, headache, weakness)     Patient states that she feels bad when she  Protocols used: Blood Pressure - High-A-AH

## 2023-11-14 ENCOUNTER — Ambulatory Visit: Payer: Self-pay | Admitting: Family Medicine

## 2023-11-14 ENCOUNTER — Telehealth: Payer: Self-pay

## 2023-11-14 MED ORDER — VITAMIN D3 1.25 MG (50000 UT) PO CAPS: 1.0000 | ORAL_CAPSULE | ORAL | 0 refills | Status: AC

## 2023-11-14 MED ORDER — VITAMIN D3 1.25 MG (50000 UT) PO CAPS
1.0000 | ORAL_CAPSULE | ORAL | 0 refills | Status: DC
Start: 2023-11-14 — End: 2023-11-14

## 2023-11-14 NOTE — Telephone Encounter (Signed)
 Copied from CRM [id number]. Topic: Clinical - Medication Question >> Nov 14, 2023  4:20 PM Shereese L wrote: Reason for RMF:[id number] has questions in reference to vitamin and how she is to take them

## 2023-11-14 NOTE — Addendum Note (Signed)
 Addended by: WENDELL ARLAND RAMAN on: 11/14/2023 04:09 PM   Modules accepted: Orders

## 2023-11-14 NOTE — Telephone Encounter (Signed)
-----   Message from Greig Ring sent at 11/14/2023  1:12 PM EDT ----- Call... Please let patient know as well as her [name] Sorrel.  DPR signed. Vitamin D  levels are very low which can cause fatigue and weakness as well as memory issues.  I will send in a prescription for once weekly high-dose vitamin D  to complete over the next 12 weeks. Electrolytes kidney and liver function are stable.  B12 is slightly high which should not cause any symptoms but she can hold it for the next month and then restart. No anemia, no sign of infection and no thyroid  issues. Follow-up with PCP if not improving as expected ----- Message ----- From: Interface, Lab In [facility] One Sent: 11/13/2023   2:57 PM EDT To: Greig FORBES Ring, MD

## 2023-11-14 NOTE — Addendum Note (Signed)
 Addended by: AVELINA NO E on: 11/14/2023 01:12 PM   Modules accepted: Orders

## 2023-11-14 NOTE — Telephone Encounter (Signed)
 Spoke to pt about how to take the Vit D and to hold the B12.

## 2023-11-14 NOTE — Telephone Encounter (Signed)
 Copied from CRM [id number]. Topic: Clinical - Lab/Test Results >> Nov 14, 2023  3:13 PM [place] J wrote: Reason for CRM: Patient is calling for her lab results. She did not want me to relay the results myself, and is specifically request for Wendell Arland RAMAN, CMA to call her.

## 2023-11-15 NOTE — Telephone Encounter (Signed)
 Spoke to pt. She did not need anything else.

## 2023-11-15 NOTE — Telephone Encounter (Signed)
 Copied from CRM [id number]. Topic: Clinical - Lab/Test Results >> Nov 14, 2023  5:30 PM Dedra B wrote: Reason for CRM: Pt called and would like a call to go over her lab results.

## 2023-11-21 ENCOUNTER — Ambulatory Visit: Payer: Self-pay

## 2023-11-21 NOTE — Telephone Encounter (Signed)
 FYI Only or Action Required?: FYI only for provider.  Patient was last seen in primary care on 11/13/2023 by Katherine Greig BRAVO, MD.  Called Nurse Triage reporting Altered Mental Status.  Symptoms began about a month ago.  Interventions attempted: Nothing.  Symptoms are: stable.  Triage Disposition: See PCP Within 2 Weeks  Patient/caregiver understands and will follow disposition?:   Copied from CRM [id number]. Topic: Clinical - Red Word Triage >> Nov 21, 2023  4:39 PM Harlene ORN wrote: Red Word that prompted transfer to Nurse Triage: having confusion due to her vitamin D  being low? Reason for Disposition  [1] Longstanding confusion (e.g., dementia, stroke) AND [2] NO worsening or change  Answer Assessment - Initial Assessment Questions [name] calling in on behalf of patient - States patient has been confused for the last month or so - ex: thinks her cell phone is bugged in some way, is paranoid about things, thinks she doesn't own her house and someone else is responsible for the bills.   1. LEVEL OF CONSCIOUSNESS: How are they (the patient) acting right now? (e.g., alert-oriented, confused, lethargic, stuporous, comatose)     Confused   2. ONSET: When did the confusion start?  (e.g., minutes, hours, days)     Last month and a half getting confused about certain things.   3. PATTERN: Does this come and go, or has it been constant since it started?  Is it present now?     Pretty constant the last month.  4. CAUSE: What do you think is causing the confusion?      Unsure, low vitamin D  levels.   5. OTHER SYMPTOMS: Are there any other symptoms? (e.g., difficulty breathing, fever, headache, weakness)     No  Protocols used: Confusion - Delirium-A-AH

## 2023-11-26 NOTE — Progress Notes (Deleted)
     November Sypher T. Markiyah Gahm, MD, CAQ Sports Medicine [facility] at [facility] [address] [place], [address]  Phone: [phone]  FAX: [phone]  Katherine Gallegos - 83 y.o. female  MRN [id number]  Date of Birth: 29-Mar-1940  Date: 11/28/2023  PCP: Jimmy Charlie LILLETTE, MD  Referral: Jimmy Charlie LILLETTE, MD  No chief complaint on file.  Subjective:   Katherine Gallegos is a 83 y.o. very pleasant female patient with There is no height or weight on file to calculate BMI. who presents with the following:  Discussed the use of AI scribe software for clinical note transcription with the patient, who gave verbal consent to proceed.  The patient presents with altered mental status for 1 month. History of Present Illness     Review of Systems is noted in the HPI, as appropriate  Objective:   There were no vitals taken for this visit.  GEN: No acute distress; alert,appropriate. PULM: Breathing comfortably in no respiratory distress PSYCH: Normally interactive.   Physical Exam   Laboratory and Imaging Data:  Assessment and Plan:   No diagnosis found. Assessment & Plan   Medication Management during today's office visit: No orders of the defined types were placed in this encounter.  There are no discontinued medications.  Orders placed today for conditions managed today: No orders of the defined types were placed in this encounter.   Disposition: No follow-ups on file.  Dragon [employer] One speech-to-text software was used for transcription in this dictation.  Possible transcriptional errors can occur using Animal nutritionist.   Signed,  Jacques DASEN. Indi Willhite, MD   Outpatient Encounter Medications as of 11/28/2023  Medication Sig   acetaminophen (TYLENOL) 325 MG tablet Take 650 mg by mouth every 6 (six) hours as needed.   amLODipine  (NORVASC ) 5 MG tablet Take 1 tablet by mouth once daily   Cholecalciferol (VITAMIN D3) 1.25 MG (50000 UT) CAPS Take 1  capsule (1.25 mg total) by mouth once a week.   cyanocobalamin  (VITAMIN B12) 1000 MCG tablet Take 1,000 mcg by mouth daily.   losartan  (COZAAR ) 100 MG tablet Take 1 tablet by mouth once daily   metroNIDAZOLE  (METROCREAM ) 0.75 % cream Apply to face at night for rosacea. Increase to twice daily with flares.   omeprazole  (PRILOSEC) 20 MG capsule Take 1 capsule (20 mg total) by mouth daily.   No facility-administered encounter medications on file as of 11/28/2023.

## 2023-11-28 ENCOUNTER — Telehealth: Payer: Self-pay

## 2023-11-28 ENCOUNTER — Ambulatory Visit: Admitting: Family Medicine

## 2023-11-28 DIAGNOSIS — R4182 Altered mental status, unspecified: Secondary | ICD-10-CM

## 2023-11-28 NOTE — Telephone Encounter (Signed)
No fee

## 2023-11-28 NOTE — Telephone Encounter (Signed)
 Copied from CRM [id number]. Topic: General - Other >> Nov 28, 2023  9:22 AM Aisha D wrote: Reason for CRM: Pt's daughter stated that she would like to be contacted if there is a fee for canceling the appt for today. CB [phone].

## 2024-01-02 ENCOUNTER — Telehealth: Payer: Self-pay

## 2024-01-02 MED ORDER — LOSARTAN POTASSIUM 100 MG PO TABS: 100.0000 mg | ORAL_TABLET | Freq: Every day | ORAL | 0 refills | Status: DC

## 2024-01-02 NOTE — Telephone Encounter (Signed)
 Pt needs a TOC with Dr Bennett sooner than the OV she has in July 2026

## 2024-01-14 ENCOUNTER — Telehealth: Payer: Self-pay | Admitting: Internal Medicine

## 2024-01-14 NOTE — Telephone Encounter (Signed)
 Pt stopped by and asked if she can have her medication refilled, the medication is losartan  100 mg. She would like a call back of when the medication is sent to her preffered pharmacy.

## 2024-01-14 NOTE — Telephone Encounter (Signed)
 Left message on VM per DPR that the medication was sent to [facility] [address] on 01-02-24. She needs to contact the pharmacy. She has 2 pharmacies in her profile and both are marked as favorites. If she is wanting the other pharmacy, we can send it there.

## 2024-01-29 ENCOUNTER — Ambulatory Visit: Payer: Self-pay

## 2024-01-29 ENCOUNTER — Encounter

## 2024-01-29 NOTE — Telephone Encounter (Signed)
 FYI Only or Action Required?: FYI only for provider: appointment scheduled on 01/31/2024.  Patient was last seen in primary care on 11/13/2023 by Avelina Greig BRAVO, MD.  Called Nurse Triage reporting Diarrhea.  Symptoms began today.  Interventions attempted: Rest, hydration, or home remedies and Dietary changes.  Symptoms are: unchanged.  Triage Disposition: See PCP When Office is Open (Within 3 Days)  Patient/caregiver understands and will follow disposition?: Yes             Reason for Disposition  [1] MILD diarrhea (e.g., 1-3 or more stools than normal in past 24 hours) AND [2] present >  7 days  (Exception: Chronic diarrhea that is not worse.)  Answer Assessment - Initial Assessment Questions 1. DIARRHEA SEVERITY: How bad is the diarrhea? How many more stools have you had in the past 24 hours than normal?      4 times  2. ONSET: When did the diarrhea begin?      This morning  3. STOOL DESCRIPTION:  How loose or watery is the diarrhea? What is the stool color? Is there any blood or mucous in the stool?     Loose and watery  4. VOMITING: Are you also vomiting? If Yes, ask: How many times in the past 24 hours?      Denies  5. ABDOMEN PAIN: Are you having any abdomen pain? If Yes, ask: What does it feel like? (e.g., crampy, dull, intermittent, constant)      Denies 6. ABDOMEN PAIN SEVERITY: If present, ask: How bad is the pain?  (e.g., Scale 1-10; mild, moderate, or severe)     0 7. ORAL INTAKE: If vomiting, Have you been able to drink liquids? How much liquids have you had in the past 24 hours?     Keep a glass of water near her at all times  8. HYDRATION: Any signs of dehydration? (e.g., dry mouth [not just dry lips], too weak to stand, dizziness, new weight loss) When did you last urinate?     Denies  9. ANTIBIOTIC USE: Are you taking antibiotics now or have you taken antibiotics in the past 2 months?       Denies  10. OTHER SYMPTOMS:  Do you have any other symptoms? (e.g., fever, blood in stool)       Denies  Protocols used: Diarrhea-A-AH

## 2024-01-29 NOTE — Telephone Encounter (Signed)
 This RN attempted to contact pt. No answer, left VM with call back number.    Message from [place] F sent at 01/29/2024  9:11 AM EST  Reason for Triage: Pt had to cancel her Transfer of Care appointment today, but started with diarrhea this morning, she was scared to have to run to the bathroom and embarrass herself. States she has no other symptoms, unsure if it was nerves or something she ate.

## 2024-01-31 ENCOUNTER — Encounter: Payer: Self-pay | Admitting: Family

## 2024-01-31 ENCOUNTER — Ambulatory Visit (INDEPENDENT_AMBULATORY_CARE_PROVIDER_SITE_OTHER): Admitting: Family

## 2024-01-31 VITALS — BP 116/72 | HR 97 | Temp 98.0°F | Wt 128.6 lb

## 2024-01-31 DIAGNOSIS — R1011 Right upper quadrant pain: Secondary | ICD-10-CM | POA: Diagnosis not present

## 2024-01-31 LAB — CBC WITH DIFFERENTIAL/PLATELET
Basophils Absolute: 0.1 K/uL (ref 0.0–0.1)
Basophils Relative: 0.7 % (ref 0.0–3.0)
Eosinophils Absolute: 0.1 K/uL (ref 0.0–0.7)
Eosinophils Relative: 1.4 % (ref 0.0–5.0)
HCT: 39.9 % (ref 36.0–46.0)
Hemoglobin: 13.3 g/dL (ref 12.0–15.0)
Lymphocytes Relative: 32.9 % (ref 12.0–46.0)
Lymphs Abs: 3.1 K/uL (ref 0.7–4.0)
MCHC: 33.4 g/dL (ref 30.0–36.0)
MCV: 89.3 fl (ref 78.0–100.0)
Monocytes Absolute: 0.5 K/uL (ref 0.1–1.0)
Monocytes Relative: 5.2 % (ref 3.0–12.0)
Neutro Abs: 5.6 K/uL (ref 1.4–7.7)
Neutrophils Relative %: 59.8 % (ref 43.0–77.0)
Platelets: 250 K/uL (ref 150.0–400.0)
RBC: 4.47 Mil/uL (ref 3.87–5.11)
RDW: 12.9 % (ref 11.5–15.5)
WBC: 9.4 K/uL (ref 4.0–10.5)

## 2024-01-31 LAB — COMPREHENSIVE METABOLIC PANEL WITH GFR
ALT: 13 U/L (ref 0–35)
AST: 18 U/L (ref 0–37)
Albumin: 4.5 g/dL (ref 3.5–5.2)
Alkaline Phosphatase: 58 U/L (ref 39–117)
BUN: 11 mg/dL (ref 6–23)
CO2: 30 meq/L (ref 19–32)
Calcium: 9.5 mg/dL (ref 8.4–10.5)
Chloride: 98 meq/L (ref 96–112)
Creatinine, Ser: 0.79 mg/dL (ref 0.40–1.20)
GFR: 70.1 mL/min (ref >60.00)
Glucose, Bld: 100 mg/dL — ABNORMAL HIGH (ref 70–99)
Potassium: 4.2 meq/L (ref 3.5–5.1)
Sodium: 138 meq/L (ref 135–145)
Total Bilirubin: 0.7 mg/dL (ref 0.2–1.2)
Total Protein: 7.6 g/dL (ref 6.0–8.3)

## 2024-01-31 NOTE — Progress Notes (Signed)
 Established Patient Office Visit  Subjective:      CC:  Chief Complaint  Patient presents with   Acute Visit    Increased diarrhea.    HPI: Katherine Gallegos is a 83 y.o. female presenting on 01/31/2024 for Acute Visit (Increased diarrhea.) .  Discussed the use of AI scribe software for clinical note transcription with the patient, who gave verbal consent to proceed.  History of Present Illness Katherine Gallegos is an 83 year old female who presents with diarrhea and abdominal discomfort.  She has been experiencing diarrhea for approximately two days, initially having four to five episodes per day, which has now decreased to two to three episodes per day. The diarrhea is described as 'pretty liquidy'. She has been taking Pepto-Bismol, which she feels has helped alleviate her symptoms.  No mucus or blood in the stool, and no significant abdominal pain, although there is some gassiness. She has not experienced any nausea, vomiting, or fever. She also denies any urinary symptoms such as increased frequency or urgency.  Her past medical history includes a hysterectomy performed due to heavy periods, but she retains her gallbladder. She mentions occasional heartburn related to dietary intake but does not report any current issues with heartburn. She follows a bland diet, consuming foods like applesauce and toast.           Social history:  Relevant past medical, surgical, family and social history reviewed and updated as indicated. Interim medical history since our last visit reviewed.  Allergies and medications reviewed and updated.  DATA REVIEWED: CHART IN EPIC     ROS: Negative unless specifically indicated above in HPI.    Current Outpatient Medications:    acetaminophen (TYLENOL) 325 MG tablet, Take 650 mg by mouth every 6 (six) hours as  needed., Disp: , Rfl:    amLODipine  (NORVASC ) 5 MG tablet, Take 1 tablet by mouth once daily, Disp: 90 tablet, Rfl: 3   Cholecalciferol (VITAMIN D3) 1.25 MG (50000 UT) CAPS, Take 1 capsule (1.25 mg total) by mouth once a week., Disp: 12 capsule, Rfl: 0   cyanocobalamin  (VITAMIN B12) 1000 MCG tablet, Take 1,000 mcg by mouth daily., Disp: , Rfl:    losartan  (COZAAR ) 100 MG tablet, Take 1 tablet (100 mg total) by mouth daily., Disp: 90 tablet, Rfl: 0   metroNIDAZOLE  (METROCREAM ) 0.75 % cream, Apply to face at night for rosacea. Increase to twice daily with flares., Disp: 45 g, Rfl: 11   omeprazole  (PRILOSEC) 20 MG capsule, Take 1 capsule (20 mg total) by mouth daily., Disp: 90 capsule, Rfl: 3        Objective:        BP 116/72 (BP Location: Left Arm, Patient Position: Sitting)   Pulse 97   Temp 98 F (36.7 C) (Temporal)   Wt 128 lb 9.6 oz (58.3 kg)   SpO2 96%   BMI 23.15 kg/m   Physical Exam ABDOMEN: Tenderness on palpation of the abdomen with localized tenderness in the right upper quadrant.  Wt Readings from Last 3 Encounters:  01/31/24 128 lb 9.6 oz (58.3 kg)  11/13/23 124 lb 6.4 oz (56.4 kg)  10/16/23 128 lb (58.1 kg)    Physical Exam Vitals reviewed.  Constitutional:      General: She is not in acute distress.    Appearance: Normal appearance. She is normal weight. She is not ill-appearing, toxic-appearing or diaphoretic.  HENT:     Head: Normocephalic.  Cardiovascular:     Rate  and Rhythm: Normal rate.  Pulmonary:     Effort: Pulmonary effort is normal.  Abdominal:     General: Bowel sounds are decreased.     Tenderness: There is abdominal tenderness in the right upper quadrant and epigastric area. Negative signs include Murphy's sign.  Musculoskeletal:        General: Normal range of motion.  Neurological:     General: No focal deficit present.     Mental Status: She is alert and oriented to person, place, and time. Mental status is at baseline.  Psychiatric:         Mood and Affect: Mood normal.        Behavior: Behavior normal.        Thought Content: Thought content normal.        Judgment: Judgment normal.          Results   Assessment & Plan:   Assessment and Plan Assessment & Plan Acute diarrhea Diarrhea for two days, initially 4-5 times daily, now improved to 2-3 times daily. No mucus or blood in stool. No fever, nausea, or vomiting. Likely viral etiology, but dietary factors considered. - Continue bland diet - Continue Pepto-Bismol as needed for symptom relief  Right upper quadrant abdominal pain Tenderness in right upper quadrant, possibly related to gallbladder. No gallbladder removal. Pain rated 6-7/10 upon palpation. No nausea, vomiting, or heartburn. Differential includes gallbladder attack. - Continue bland diet, avoid fried and fatty foods - Monitor for worsening symptoms such as increased pain, nausea, or vomiting - Ordered blood work to check white blood cells and liver function - Will consider imaging if symptoms persist or worsen        Return if symptoms worsen or fail to improve.     Ginger Patrick, MSN, APRN, FNP-C [facility]

## 2024-02-03 ENCOUNTER — Ambulatory Visit: Payer: Self-pay | Admitting: Family

## 2024-02-03 NOTE — Progress Notes (Signed)
 Labs normal.

## 2024-04-01 ENCOUNTER — Telehealth: Payer: Self-pay

## 2024-04-01 ENCOUNTER — Ambulatory Visit

## 2024-04-01 ENCOUNTER — Encounter

## 2024-04-01 VITALS — BP 130/80 | HR 96 | Temp 98.9°F | Ht 62.5 in | Wt 124.0 lb

## 2024-04-01 DIAGNOSIS — R5383 Other fatigue: Secondary | ICD-10-CM | POA: Diagnosis not present

## 2024-04-01 DIAGNOSIS — R630 Anorexia: Secondary | ICD-10-CM

## 2024-04-01 DIAGNOSIS — I10 Essential (primary) hypertension: Secondary | ICD-10-CM | POA: Diagnosis not present

## 2024-04-01 DIAGNOSIS — R634 Abnormal weight loss: Secondary | ICD-10-CM | POA: Diagnosis not present

## 2024-04-01 DIAGNOSIS — E559 Vitamin D deficiency, unspecified: Secondary | ICD-10-CM | POA: Diagnosis not present

## 2024-04-01 DIAGNOSIS — R7303 Prediabetes: Secondary | ICD-10-CM

## 2024-04-01 DIAGNOSIS — R7989 Other specified abnormal findings of blood chemistry: Secondary | ICD-10-CM | POA: Diagnosis not present

## 2024-04-01 MED ORDER — MIRTAZAPINE 7.5 MG PO TABS: 7.5000 mg | ORAL_TABLET | Freq: Every day | ORAL | 0 refills | Status: AC

## 2024-04-01 MED ORDER — AMLODIPINE BESYLATE 5 MG PO TABS: 5.0000 mg | ORAL_TABLET | Freq: Every day | ORAL | 3 refills | Status: AC

## 2024-04-01 MED ORDER — LOSARTAN POTASSIUM 100 MG PO TABS: 100.0000 mg | ORAL_TABLET | Freq: Every day | ORAL | 3 refills | Status: DC

## 2024-04-01 MED ORDER — OMEPRAZOLE 20 MG PO CPDR: 20.0000 mg | DELAYED_RELEASE_CAPSULE | Freq: Every day | ORAL | Status: AC | PRN

## 2024-04-01 NOTE — Patient Instructions (Addendum)
 Thank you for visiting [facility] today! Here's what we talked about: - START Mirtazipine for your appetite

## 2024-04-01 NOTE — Progress Notes (Signed)
 "  Subjective:   This visit was conducted in person. The patient gave informed consent to the use of Abridge AI technology to record the contents of the encounter as documented below.   Patient ID: Katherine Gallegos, female    DOB: 10-May-1940, 84 y.o.   MRN: [id number]   Discussed the use of AI scribe software for clinical note transcription with the patient, who gave verbal consent to proceed.  History of Present Illness ZAMANTHA Gallegos is an 84 year old female who presents with poor appetite and weight loss.  She has experienced a poor appetite for approximately two months, feeling as though she is forcing herself to eat despite being able to finish her meals. There are no changes in taste, difficulty swallowing, or dietary changes. She has lost about 17 pounds over the past year, with 13 pounds lost from January 2024 to mid-2025, and an additional 4 pounds since mid-2025.  Her medical history includes hypertension, managed with losartan  and amlodipine , and vein issues in her legs. She also has acid reflux, which she controls with dietary changes and occasional Prilosec. She takes hydrochlorothiazide and lovastatin. She has prediabetes, identified in July 2025, and monitors her blood sugar levels. Her past medical history includes skin cancer, breast biopsies, an appendectomy, a partial hysterectomy, colon surgery in 2014, and a colonoscopy in 2023 that revealed precancerous polyps.  Family history is significant for colon cancer, lung cancer, and breast cancer among her siblings. She has experienced recent fatigue and feels tired more often. She denies alcohol or tobacco use. She lost her husband in 2018 and has two sons and one daughter.  She takes vitamin D  weekly and is awaiting a repeat level check. She previously took vitamin B12 but paused due to high levels four months ago.      Review of Systems  All other systems reviewed and are negative.       Allergies[1]  Medications  Ordered Prior to Encounter[2]  BP 130/80 (BP Location: Left Arm, Patient Position: Sitting, Cuff Size: Normal)   Pulse 96   Temp 98.9 F (37.2 C) (Oral)   Ht 5' 2.5 (1.588 m)   Wt 124 lb (56.2 kg)   SpO2 96%   BMI 22.32 kg/m   Objective:      Physical Exam VITALS: BP- 130/80 GENERAL: Alert, cooperative, well developed, no acute distress. HEAD: Normocephalic atraumatic. EYES: Extraocular movements intact BL, pupils round, equal and reactive to light BL, conjunctivae normal BL. EXTREMITIES: No cyanosis or edema. NEUROLOGICAL: Oriented to person, place and time, no gait abnormalities, moves all extremities without gross motor or sensory deficit. PSYCH: normal mood and affect     Assessment & Plan:   Assessment & Plan Poor appetite Weight loss Fatigue Reduced appetite and weight loss of 13 pounds from January 2024 to January 2025 and 4 pounds from the middle of 2025 to the present. Possible mood-related (given loneliness in light of the death of her husband several years ago), will trial low-dose mirtazapine .  No dysphagia, odynophagia, abdominal discomfort, bloody stools or other symptoms at this time.  Extensively discussed that weight loss and fatigue could be a sign of occult malignancy.  Per chart review there is extensive family history of cancer including breast cancer in sister and lung cancer in brother.  Per chart review colonoscopy in 2023 revealed precancerous polyps, no plans were made for repeat possibly considering patient's age.  Mammogram from 2023 was also negative.  Suggested CTAP in light of current  symptoms to rule out occult malignancy, patient declines at this time, stating she would like to see how things go first.   - Started low dose mirtazapine  at night to stimulate appetite and address low mood. - Scheduled follow-up in four weeks to assess response to mirtazapine  and weight changes. -Will aim for complete PHQ-9 and GAD-7 at subsequent visit. - Will  further discuss memory at subsequent visit, and diagnosed dementia could be contributing. - Will re-discuss obtaining CTAP at next visit  Prediabetes Elevated blood sugar levels consistent with prediabetes. Discussed progression risk, which decreases with age. - Will consider repeating blood sugar levels at six-month interval or annually.  Hypertension Blood pressure well-controlled at 130/80 mmHg on current regimen. - Refilled losartan  and amlodipine  prescriptions at [facility], continue current dosages.  Vitamin D  deficiency Currently on high-dose vitamin D  supplementation. Plan to reassess levels for potential transition to standard dose. - Ordered fasting vitamin D  level. - Instructed to continue current high-dose vitamin D  until results are available. - Will discuss potential transition to over-the-counter vitamin D  based on results.  Elevated vitamin B12 level Previously elevated vitamin B12 level noted. Advised to pause supplementation. - Ordered vitamin B12 level. - Instructed to hold vitamin B12 supplementation until results are available.    Return in about 4 weeks (around 04/29/2024) for Mood, apetite, memory. Needs fasting lab visit as well.   Chanell Nadeau K Bettey Muraoka, MD  04/01/2024     Contains text generated by Abridge.        [1]  Allergies Allergen Reactions   Hydrochlorothiazide Rash   Latex Rash   Lovastatin Other (See Comments)    Muscle pain  [2]  Current Outpatient Medications on File Prior to Visit  Medication Sig Dispense Refill   acetaminophen (TYLENOL) 325 MG tablet Take 650 mg by mouth every 6 (six) hours as needed.     Cholecalciferol (VITAMIN D3) 1.25 MG (50000 UT) CAPS Take 1 capsule (1.25 mg total) by mouth once a week. 12 capsule 0   cyanocobalamin  (VITAMIN B12) 1000 MCG tablet Take 1,000 mcg by mouth daily.     metroNIDAZOLE  (METROCREAM ) 0.75 % cream Apply to face at night for rosacea. Increase to twice daily with flares. 45 g 11   No current  facility-administered medications on file prior to visit.   "

## 2024-04-01 NOTE — Telephone Encounter (Signed)
 Pt's [name] Sorrel ([place]) was hoping to speak to Dr Bennett some more about the recent episodes of hallucinations and thoughts. She mentioned that the pt thinks her son is trying to take her house. She has said some other things, also.

## 2024-04-02 NOTE — Telephone Encounter (Signed)
 Since PCP is out of office can we triage please

## 2024-04-02 NOTE — Telephone Encounter (Signed)
 I spoke with Katherine Gallegos signed) and Katherine wants to know if there is med to be given for dementia. Katherine said pt is more forgetful and pt thinks someone is watching her house and no one is per Katherine and pt has accused pts son of doing things that the rest of the family knows is not true.  NO SI/HI. Pt already has appt to see DR Bennett to follow up on low mood and pt starting Mirtazapine . Katherine is coming with pt to that appt but Katherine wants to speak with dr Bennett before Dr Bennett sees pt to discuss forgetfulness and see if there is med for dementia pt might could be talking. UC & ED precautions given and Katherine Gallegos voiced understanding. Katherine does not need cb and will speak with Dr Bennett  on 05/05/24 prior to pt being seen.sending note to CHRISTELLA Lav, Dr Bennett upon her return to office on 05/07/24 and [place] [facility].

## 2024-04-03 NOTE — Telephone Encounter (Signed)
 NOTED

## 2024-04-09 ENCOUNTER — Other Ambulatory Visit: Payer: Self-pay | Admitting: Family

## 2024-04-28 ENCOUNTER — Other Ambulatory Visit

## 2024-04-30 ENCOUNTER — Other Ambulatory Visit

## 2024-05-05 ENCOUNTER — Ambulatory Visit

## 2024-09-07 ENCOUNTER — Ambulatory Visit

## 2024-09-08 ENCOUNTER — Ambulatory Visit

## 2024-10-20 ENCOUNTER — Encounter

## 2024-11-02 ENCOUNTER — Encounter: Admitting: Dermatology
